# Patient Record
Sex: Female | Born: 1975 | Race: White | Hispanic: No | Marital: Single | Smoking: Never smoker
Health system: Southern US, Community
[De-identification: ages and names within clinical notes are randomized; demographics above are authoritative.]

## PROBLEM LIST (undated history)

## (undated) DIAGNOSIS — C4490 Unspecified malignant neoplasm of skin, unspecified: Secondary | ICD-10-CM

## (undated) DIAGNOSIS — D259 Leiomyoma of uterus, unspecified: Secondary | ICD-10-CM

## (undated) DIAGNOSIS — F419 Anxiety disorder, unspecified: Secondary | ICD-10-CM

## (undated) DIAGNOSIS — R87629 Unspecified abnormal cytological findings in specimens from vagina: Secondary | ICD-10-CM

## (undated) DIAGNOSIS — R87619 Unspecified abnormal cytological findings in specimens from cervix uteri: Secondary | ICD-10-CM

## (undated) DIAGNOSIS — M419 Scoliosis, unspecified: Secondary | ICD-10-CM

## (undated) DIAGNOSIS — O24419 Gestational diabetes mellitus in pregnancy, unspecified control: Secondary | ICD-10-CM

## (undated) HISTORY — DX: Unspecified abnormal cytological findings in specimens from cervix uteri: R87.619

## (undated) HISTORY — DX: Anxiety disorder, unspecified: F41.9

## (undated) HISTORY — PX: OTHER SURGICAL HISTORY: SHX169

## (undated) HISTORY — DX: Scoliosis, unspecified: M41.9

## (undated) HISTORY — DX: Unspecified abnormal cytological findings in specimens from vagina: R87.629

## (undated) HISTORY — DX: Unspecified malignant neoplasm of skin, unspecified: C44.90

## (undated) HISTORY — DX: Leiomyoma of uterus, unspecified: D25.9

## (undated) HISTORY — PX: AUGMENTATION MAMMAPLASTY: SUR837

---

## 2006-06-29 ENCOUNTER — Emergency Department (HOSPITAL_COMMUNITY): Admission: EM | Admit: 2006-06-29 | Discharge: 2006-06-29 | Payer: Self-pay | Admitting: Emergency Medicine

## 2017-07-01 ENCOUNTER — Encounter: Payer: Self-pay | Admitting: *Deleted

## 2017-07-01 ENCOUNTER — Encounter: Payer: Self-pay | Admitting: Obstetrics & Gynecology

## 2017-07-01 DIAGNOSIS — D219 Benign neoplasm of connective and other soft tissue, unspecified: Secondary | ICD-10-CM | POA: Insufficient documentation

## 2017-07-01 DIAGNOSIS — O09519 Supervision of elderly primigravida, unspecified trimester: Secondary | ICD-10-CM | POA: Insufficient documentation

## 2017-07-01 DIAGNOSIS — Z3400 Encounter for supervision of normal first pregnancy, unspecified trimester: Secondary | ICD-10-CM | POA: Insufficient documentation

## 2017-07-02 ENCOUNTER — Encounter (INDEPENDENT_AMBULATORY_CARE_PROVIDER_SITE_OTHER): Payer: Self-pay

## 2017-07-02 ENCOUNTER — Encounter: Payer: Self-pay | Admitting: Obstetrics & Gynecology

## 2017-07-02 ENCOUNTER — Ambulatory Visit (INDEPENDENT_AMBULATORY_CARE_PROVIDER_SITE_OTHER): Payer: 59 | Admitting: Obstetrics & Gynecology

## 2017-07-02 VITALS — BP 93/63 | HR 75 | Ht 69.0 in | Wt 171.0 lb

## 2017-07-02 DIAGNOSIS — Z23 Encounter for immunization: Secondary | ICD-10-CM | POA: Diagnosis not present

## 2017-07-02 DIAGNOSIS — Z3402 Encounter for supervision of normal first pregnancy, second trimester: Secondary | ICD-10-CM

## 2017-07-02 DIAGNOSIS — N760 Acute vaginitis: Secondary | ICD-10-CM | POA: Diagnosis not present

## 2017-07-02 DIAGNOSIS — B379 Candidiasis, unspecified: Secondary | ICD-10-CM | POA: Diagnosis not present

## 2017-07-02 DIAGNOSIS — B9689 Other specified bacterial agents as the cause of diseases classified elsewhere: Secondary | ICD-10-CM | POA: Diagnosis not present

## 2017-07-02 DIAGNOSIS — Z803 Family history of malignant neoplasm of breast: Secondary | ICD-10-CM | POA: Insufficient documentation

## 2017-07-02 DIAGNOSIS — Z3400 Encounter for supervision of normal first pregnancy, unspecified trimester: Secondary | ICD-10-CM

## 2017-07-02 MED ORDER — TRIAMCINOLONE ACETONIDE 0.5 % EX OINT: TOPICAL_OINTMENT | CUTANEOUS | 0 refills | Status: DC

## 2017-07-02 MED ORDER — METRONIDAZOLE 500 MG PO TABS: 500.0000 mg | ORAL_TABLET | Freq: Two times a day (BID) | ORAL | 0 refills | Status: DC

## 2017-07-02 MED ORDER — TERCONAZOLE 0.4 % VA CREA: 1.0000 | TOPICAL_CREAM | Freq: Every day | VAGINAL | 0 refills | Status: DC

## 2017-07-02 MED FILL — metroNIDAZOLE 500 MG TABS: 500 | 7 days supply | Qty: 14 | Fill #0

## 2017-07-02 MED FILL — TRIAMCINOLONE 0.5% OINTMENT: 0.5 | 8 days supply | Qty: 30 | Fill #0

## 2017-07-02 MED FILL — TERCONAZOLE 0.4% VAG CREAM: 0.4 | 7 days supply | Qty: 45 | Fill #0

## 2017-07-02 NOTE — Progress Notes (Signed)
Up to date on pap smear- normal results

## 2017-07-02 NOTE — Progress Notes (Signed)
  Subjective:    Caitlin Cain is a G1P0 [redacted]w[redacted]d being seen today for her first obstetrical visit.  Her obstetrical history is significant for advanced maternal age, large fibroids, vaginal pressure and discharge not responding to multiple OTC courses of Monistat. Patient does intend to breast feed. Pregnancy history fully reviewed.  Patient reports pelvic pressure from fibroids..  Vitals:   07/01/17 1210 07/02/17 1034  BP:  93/63  Pulse:  75  Weight:  171 lb (77.6 kg)  Height: 5\' 9"  (1.753 m)     HISTORY: OB History  Gravida Para Term Preterm AB Living  1            SAB TAB Ectopic Multiple Live Births               # Outcome Date GA Lbr Len/2nd Weight Sex Delivery Anes PTL Lv  1 Current              Past Medical History:  Diagnosis Date  . Abnormal Pap smear of cervix   . Anxiety   . Scoliosis   . Skin cancer   . Uterine fibroid   . Vaginal Pap smear, abnormal    History reviewed. No pertinent surgical history. Family History  Problem Relation Age of Onset  . Breast cancer Mother   . Heart disease Father   . Breast cancer Maternal Aunt   . Breast cancer Maternal Grandfather      Exam    Uterus:     Pelvic Exam:    Perineum: No Hemorrhoids   Vulva: erythematous   Vagina:  curdlike discharge, vaginal walls inflames and tender   pH: n/a   Cervix: closed, very anterior above symphysis due to large LUS fibroid   Adnexa: not evaluated   Bony Pelvis: average  System: Breast:  normal appearance, no masses or tenderness   Skin: normal coloration and turgor, no rashes    Neurologic: oriented, normal mood   Extremities: no deformities   HEENT sclera clear, anicteric   Mouth/Teeth mucous membranes moist, pharynx normal without lesions and dental hygiene good   Neck Not evaluated   Cardiovascular: regular rate and rhythm   Respiratory:  appears well, vitals normal, no respiratory distress, acyanotic, normal RR   Abdomen: large fibroids   Urinary: urethral  meatus normal      Assessment:    Pregnancy: G1P0 Patient Active Problem List   Diagnosis Date Noted  . Supervision of normal first pregnancy, antepartum 07/01/2017  . Uterine fibroid 07/01/2017  . Advanced maternal age, 1st pregnancy 07/01/2017        Plan:     Initial labs drawn. Prenatal vitamins. Problem list reviewed and updated. Declines all genetic testing MFM [place] for evaluation and growth  Vaginitis--Terazol needed clinically.  BD affirm sent.  Pt external inflammation and wil do local treatment with steroid ointment.  Pt going out of town this weekend and wants to have Flagyl on hand if BV is present.  Flagyl prescribed but she understnads NOT to take it until she hears from our office  Fibroids- ibuprofen once a day for 3 days as needed.  Pt understands use sparingly until 32 weeks.  Growth [place] for AMA and large fibroids which can cause IUGR.  Anxiety--Continue Vistiril for anxiety.  Silas Sacramento 07/02/2017

## 2017-07-03 LAB — CERVICOVAGINAL ANCILLARY ONLY
BACTERIAL VAGINITIS: NEGATIVE
Candida vaginitis: POSITIVE — AB

## 2017-07-06 ENCOUNTER — Telehealth: Payer: Self-pay | Admitting: *Deleted

## 2017-07-06 NOTE — Telephone Encounter (Signed)
Pt notified that she does not have BV and not to take the Flagyl but she does have yeast and she is to continue with the Terazol 7

## 2017-07-14 ENCOUNTER — Other Ambulatory Visit: Payer: Self-pay | Admitting: Obstetrics & Gynecology

## 2017-07-14 ENCOUNTER — Ambulatory Visit (HOSPITAL_COMMUNITY)
Admission: RE | Admit: 2017-07-14 | Discharge: 2017-07-14 | Disposition: A | Discharge status: Home / Self Care | Payer: 59 | Source: Ambulatory Visit | Attending: Obstetrics & Gynecology | Admitting: Obstetrics & Gynecology

## 2017-07-14 DIAGNOSIS — Z3689 Encounter for other specified antenatal screening: Secondary | ICD-10-CM | POA: Insufficient documentation

## 2017-07-14 DIAGNOSIS — D251 Intramural leiomyoma of uterus: Secondary | ICD-10-CM

## 2017-07-14 DIAGNOSIS — Z3A23 23 weeks gestation of pregnancy: Secondary | ICD-10-CM | POA: Insufficient documentation

## 2017-07-14 DIAGNOSIS — Z3400 Encounter for supervision of normal first pregnancy, unspecified trimester: Secondary | ICD-10-CM

## 2017-07-14 DIAGNOSIS — O09522 Supervision of elderly multigravida, second trimester: Secondary | ICD-10-CM | POA: Diagnosis present

## 2017-07-14 DIAGNOSIS — D259 Leiomyoma of uterus, unspecified: Secondary | ICD-10-CM | POA: Insufficient documentation

## 2017-07-14 DIAGNOSIS — O09512 Supervision of elderly primigravida, second trimester: Secondary | ICD-10-CM

## 2017-07-14 DIAGNOSIS — O3412 Maternal care for benign tumor of corpus uteri, second trimester: Secondary | ICD-10-CM | POA: Diagnosis not present

## 2017-07-16 ENCOUNTER — Other Ambulatory Visit: Payer: Self-pay | Admitting: *Deleted

## 2017-07-16 DIAGNOSIS — O09521 Supervision of elderly multigravida, first trimester: Secondary | ICD-10-CM

## 2017-07-17 ENCOUNTER — Other Ambulatory Visit: Payer: Self-pay | Admitting: *Deleted

## 2017-07-17 DIAGNOSIS — B3731 Acute candidiasis of vulva and vagina: Secondary | ICD-10-CM

## 2017-07-17 DIAGNOSIS — B373 Candidiasis of vulva and vagina: Secondary | ICD-10-CM

## 2017-07-17 MED ORDER — TERCONAZOLE 0.4 % VA CREA: 1.0000 | TOPICAL_CREAM | Freq: Every day | VAGINAL | 0 refills | Status: DC

## 2017-07-17 MED FILL — TERCONAZOLE 0.4% VAG CREAM: 0.4 | 7 days supply | Qty: 45 | Fill #0

## 2017-08-03 ENCOUNTER — Telehealth: Payer: Self-pay | Admitting: Obstetrics & Gynecology
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
## 2017-08-04 ENCOUNTER — Ambulatory Visit (INDEPENDENT_AMBULATORY_CARE_PROVIDER_SITE_OTHER): Payer: 59 | Admitting: Obstetrics & Gynecology

## 2017-08-04 VITALS — BP 107/63 | HR 76 | Wt 177.0 lb

## 2017-08-04 DIAGNOSIS — N1339 Other hydronephrosis: Secondary | ICD-10-CM

## 2017-08-04 DIAGNOSIS — D252 Subserosal leiomyoma of uterus: Secondary | ICD-10-CM

## 2017-08-04 DIAGNOSIS — Z3400 Encounter for supervision of normal first pregnancy, unspecified trimester: Secondary | ICD-10-CM

## 2017-08-04 DIAGNOSIS — D251 Intramural leiomyoma of uterus: Secondary | ICD-10-CM

## 2017-08-04 MED ORDER — HYDROCODONE-ACETAMINOPHEN 5-325 MG PO TABS: ORAL_TABLET | ORAL | 0 refills | Status: DC

## 2017-08-04 MED ORDER — ONDANSETRON HCL 4 MG PO TABS: 4.0000 mg | ORAL_TABLET | Freq: Three times a day (TID) | ORAL | 0 refills | Status: DC | PRN

## 2017-08-05 ENCOUNTER — Ambulatory Visit (HOSPITAL_COMMUNITY)
Admission: RE | Admit: 2017-08-05 | Discharge: 2017-08-05 | Disposition: A | Discharge status: Home / Self Care | Payer: 59 | Source: Ambulatory Visit | Attending: Obstetrics & Gynecology | Admitting: Obstetrics & Gynecology

## 2017-08-05 ENCOUNTER — Ambulatory Visit: Payer: 59

## 2017-08-05 DIAGNOSIS — N1339 Other hydronephrosis: Secondary | ICD-10-CM

## 2017-08-05 DIAGNOSIS — N1330 Unspecified hydronephrosis: Secondary | ICD-10-CM | POA: Diagnosis not present

## 2017-08-05 DIAGNOSIS — N390 Urinary tract infection, site not specified: Secondary | ICD-10-CM

## 2017-08-05 LAB — COMPREHENSIVE METABOLIC PANEL
AG RATIO: 1.1 (calc) (ref 1.0–2.5)
ALT: 18 U/L (ref 6–29)
AST: 14 U/L (ref 10–30)
Albumin: 3.4 g/dL — ABNORMAL LOW (ref 3.6–5.1)
Alkaline phosphatase (APISO): 83 U/L (ref 33–115)
BUN: 7 mg/dL (ref 7–25)
CALCIUM: 8.9 mg/dL (ref 8.6–10.2)
CO2: 25 mmol/L (ref 20–32)
Chloride: 102 mmol/L (ref 98–110)
Creat: 0.55 mg/dL (ref 0.50–1.10)
GLUCOSE: 80 mg/dL (ref 65–99)
Globulin: 3.1 g/dL (calc) (ref 1.9–3.7)
Potassium: 4 mmol/L (ref 3.5–5.3)
Sodium: 136 mmol/L (ref 135–146)
Total Bilirubin: 0.3 mg/dL (ref 0.2–1.2)
Total Protein: 6.5 g/dL (ref 6.1–8.1)

## 2017-08-05 LAB — CBC
HEMATOCRIT: 37.7 % (ref 35.0–45.0)
Hemoglobin: 13 g/dL (ref 11.7–15.5)
MCH: 29.7 pg (ref 27.0–33.0)
MCHC: 34.5 g/dL (ref 32.0–36.0)
MCV: 86.3 fL (ref 80.0–100.0)
MPV: 9.3 fL (ref 7.5–12.5)
PLATELETS: 342 10*3/uL (ref 140–400)
RBC: 4.37 10*6/uL (ref 3.80–5.10)
RDW: 12.6 % (ref 11.0–15.0)
WBC: 12.6 10*3/uL — AB (ref 3.8–10.8)

## 2017-08-05 LAB — PROTEIN / CREATININE RATIO, URINE
Creatinine, Urine: 77 mg/dL (ref 20–275)
PROTEIN/CREAT RATIO: 195 mg/g{creat} — AB (ref 21–161)
TOTAL PROTEIN, URINE: 15 mg/dL (ref 5–24)

## 2017-08-05 NOTE — Progress Notes (Signed)
Pt advised to drop urine off at the office so that we can send for urine culture.  Pt informed that we will call her with results.  Pt stated understanding with no further questions.

## 2017-08-05 NOTE — Progress Notes (Signed)
Patient seen and assessed by nursing staff.  Agree with documentation and plan.  

## 2017-08-07 LAB — URINE CULTURE

## 2017-08-07 NOTE — Progress Notes (Signed)
   PRENATAL VISIT NOTE  Subjective:  Caitlin Cain is a 41 y.o. G1P0 at 10w3dbeing seen today for ongoing prenatal care.  She is currently monitored for the following issues for this high-risk pregnancy and has Supervision of normal first pregnancy, antepartum; Uterine fibroid; Advanced maternal age, 1st pregnancy; and Family history of breast cancer in first degree relative on her problem list.   [facility].  Pt found to have hydronephrosis on right due to cmopression by fibroid.   Pt also thought to have infarction of lower fibroid (no blood flow on [place].    Patient reports pelvic pain, urinary frequency, nausea when pain is severe.  Contractions: Not present. Vag. Bleeding: None.  Movement: Present. Denies leaking of fluid.   The following portions of the patient's history were reviewed and updated as appropriate: allergies, current medications, past family history, past medical history, past social history, past surgical history and problem list. Problem list updated.  Objective:   Vitals:   08/04/17 1449  BP: 107/63  Pulse: 76  Weight: 177 lb (80.3 kg)    Fetal Status: Fetal Heart Rate (bpm): 150   Movement: Present     General:  Alert, oriented and cooperative. Patient is in no acute distress.  Skin: Skin is warm and dry. No rash noted.   Cardiovascular: Normal heart rate noted  Respiratory: Normal respiratory effort, no problems with respiration noted  Abdomen: Soft, gravid, appropriate for gestational age.  Pain/Pressure: Present     Pelvic: Cervical exam performed      closed, very anterior and displaced by LUS/cervical fibroid.  Extremities: Normal range of motion.  Edema: None  Mental Status:  Normal mood and affect. Normal behavior. Normal judgment and thought content.   Assessment and Plan:  Pregnancy: G1P0 at 234w3d1. Other hydronephrosis - [place]; Future - CBC - Comp Met (CMET) - Ambulatory  referral to Urology - Protein / creatinine ratio, urine - UA, U cx  2. Supervision of normal first pregnancy, antepartum -GCT next visit  3. Intramural and subserous leiomyoma of uterus -Serial growth [place] MFM -PTL precautions reviewed -Limited Norco for pain (pt understands risk of NAS with continued use) -Ibuprofen prn; not to continue after 32 weeks -Zofran for nausea  Preterm labor symptoms and general obstetric precautions including but not limited to vaginal bleeding, contractions, leaking of fluid and fetal movement were reviewed in detail with the patient. Please refer to After Visit Summary for other counseling recommendations.  Return in about 2 weeks (around 08/18/2017).   KeSilas SacramentoMD

## 2017-08-18 ENCOUNTER — Encounter (HOSPITAL_COMMUNITY): Payer: Self-pay

## 2017-08-18 ENCOUNTER — Ambulatory Visit (HOSPITAL_COMMUNITY)
Admission: RE | Admit: 2017-08-18 | Discharge: 2017-08-18 | Disposition: A | Discharge status: Home / Self Care | Payer: 59 | Source: Ambulatory Visit | Attending: Obstetrics & Gynecology | Admitting: Obstetrics & Gynecology

## 2017-08-18 DIAGNOSIS — Z3A28 28 weeks gestation of pregnancy: Secondary | ICD-10-CM | POA: Diagnosis not present

## 2017-08-18 DIAGNOSIS — O09522 Supervision of elderly multigravida, second trimester: Secondary | ICD-10-CM | POA: Insufficient documentation

## 2017-08-18 DIAGNOSIS — O09521 Supervision of elderly multigravida, first trimester: Secondary | ICD-10-CM

## 2017-08-18 DIAGNOSIS — O3412 Maternal care for benign tumor of corpus uteri, second trimester: Secondary | ICD-10-CM | POA: Diagnosis not present

## 2017-08-18 DIAGNOSIS — D259 Leiomyoma of uterus, unspecified: Secondary | ICD-10-CM | POA: Diagnosis not present

## 2017-08-18 DIAGNOSIS — Z3689 Encounter for other specified antenatal screening: Secondary | ICD-10-CM | POA: Insufficient documentation

## 2017-08-19 ENCOUNTER — Ambulatory Visit (INDEPENDENT_AMBULATORY_CARE_PROVIDER_SITE_OTHER): Payer: 59 | Admitting: Obstetrics & Gynecology

## 2017-08-19 ENCOUNTER — Other Ambulatory Visit: Payer: Self-pay | Admitting: Obstetrics & Gynecology

## 2017-08-19 VITALS — BP 107/66 | HR 76 | Wt 179.0 lb

## 2017-08-19 DIAGNOSIS — B3731 Acute candidiasis of vulva and vagina: Secondary | ICD-10-CM

## 2017-08-19 DIAGNOSIS — O09513 Supervision of elderly primigravida, third trimester: Secondary | ICD-10-CM

## 2017-08-19 DIAGNOSIS — B373 Candidiasis of vulva and vagina: Secondary | ICD-10-CM

## 2017-08-19 DIAGNOSIS — Z331 Pregnant state, incidental: Secondary | ICD-10-CM

## 2017-08-19 DIAGNOSIS — O09512 Supervision of elderly primigravida, second trimester: Secondary | ICD-10-CM

## 2017-08-19 DIAGNOSIS — O09522 Supervision of elderly multigravida, second trimester: Secondary | ICD-10-CM

## 2017-08-19 DIAGNOSIS — F909 Attention-deficit hyperactivity disorder, unspecified type: Secondary | ICD-10-CM | POA: Insufficient documentation

## 2017-08-19 LAB — TIQ-NTM

## 2017-08-19 MED ORDER — TERCONAZOLE 0.4 % VA CREA: 1.0000 | TOPICAL_CREAM | Freq: Every day | VAGINAL | 0 refills | Status: DC

## 2017-08-19 MED ORDER — ZOLPIDEM TARTRATE 10 MG PO TABS: 5.0000 mg | ORAL_TABLET | Freq: Every evening | ORAL | 1 refills | Status: DC | PRN

## 2017-08-19 MED ORDER — TRIAMCINOLONE ACETONIDE 0.5 % EX CREA: TOPICAL_CREAM | Freq: Two times a day (BID) | CUTANEOUS | Status: DC

## 2017-08-19 MED FILL — TERCONAZOLE 0.4% VAG CREAM: 0.4 | 7 days supply | Qty: 45 | Fill #0

## 2017-08-19 MED FILL — ZOLPIDEM TARTRATE 10 MG TAB: 10 | 30 days supply | Qty: 15 | Fill #0

## 2017-08-19 NOTE — Progress Notes (Signed)
   PRENATAL VISIT NOTE  Subjective:  Caitlin Cain is a 41 y.o. G1P0 at [redacted]w[redacted]d being seen today for ongoing prenatal care.  She is currently monitored for the following issues for this high-risk pregnancy and has Supervision of normal first pregnancy, antepartum; Uterine fibroid; Advanced maternal age, 1st pregnancy; Family history of breast cancer in first degree relative; and Attention deficit hyperactivity disorder on their problem list.  Patient reports pan from fibroid and problems sleeping.  Benadryl not wworking.  Contractions: Not present. Vag. Bleeding: None.  Movement: Present. Denies leaking of fluid.   The following portions of the patient's history were reviewed and updated as appropriate: allergies, current medications, past family history, past medical history, past social history, past surgical history and problem list. Problem list updated.  Objective:   Vitals:   08/19/17 1102  BP: 107/66  Pulse: 76  Weight: 179 lb (81.2 kg)    Fetal Status: Fetal Heart Rate (bpm): 143   Movement: Present     General:  Alert, oriented and cooperative. Patient is in no acute distress.  Skin: Skin is warm and dry. No rash noted.   Cardiovascular: Normal heart rate noted  Respiratory: Normal respiratory effort, no problems with respiration noted  Abdomen: Soft, gravid, appropriate for gestational age.  Pain/Pressure: Present     Pelvic: Cervical exam performed      CERVIX CLOSED AND DISPLACED BY FIBROID.  VAGINA RED WITH D/C CONSISTENT WITH YEAST  Extremities: Normal range of motion.  Edema: None  Mental Status:  Normal mood and affect. Normal behavior. Normal judgment and thought content.   Assessment and Plan:  Pregnancy: G1P0 at [redacted]w[redacted]d  1. Elderly multigravida in third trimester - [place] MFM OB FOLLOW UP; Future--Growth Q4 weeks - Antenatal testing at 36 weeks - 2Hr GTT w/ 1 Hr Carpenter 75 g - CBC - HIV antibody (with reflex) - RPR  2. Vaginal yeast infection -  Cervicovaginal ancillary only - Fungus Culture & Smear - Terazol   Preterm labor symptoms and general obstetric precautions including but not limited to vaginal bleeding, contractions, leaking of fluid and fetal movement were reviewed in detail with the patient. Please refer to After Visit Summary for other counseling recommendations.  Return in about 2 weeks (around 09/02/2017).   Silas Sacramento, MD

## 2017-08-20 ENCOUNTER — Other Ambulatory Visit: Payer: Self-pay | Admitting: *Deleted

## 2017-08-20 MED ORDER — TRIAMCINOLONE ACETONIDE 0.5 % EX OINT: TOPICAL_OINTMENT | CUTANEOUS | 0 refills | Status: DC

## 2017-08-20 MED FILL — TRIAMCINOLONE 0.5% OINTMENT: 0.5 | 10 days supply | Qty: 30 | Fill #0

## 2017-08-20 NOTE — Addendum Note (Signed)
Addended by: Asencion Islam on: 08/20/2017 10:43 AM   Modules accepted: Orders

## 2017-08-20 NOTE — Telephone Encounter (Signed)
Pt called stating that her kenalog cream was not at her pharmacy.  Med resend to [facility] outpatient pharmacy.

## 2017-08-21 ENCOUNTER — Other Ambulatory Visit (INDEPENDENT_AMBULATORY_CARE_PROVIDER_SITE_OTHER): Payer: 59

## 2017-08-21 DIAGNOSIS — Z23 Encounter for immunization: Secondary | ICD-10-CM

## 2017-08-21 DIAGNOSIS — B3731 Acute candidiasis of vulva and vagina: Secondary | ICD-10-CM

## 2017-08-21 DIAGNOSIS — B373 Candidiasis of vulva and vagina: Secondary | ICD-10-CM

## 2017-08-21 DIAGNOSIS — Z3400 Encounter for supervision of normal first pregnancy, unspecified trimester: Secondary | ICD-10-CM

## 2017-08-21 NOTE — Addendum Note (Signed)
Addended by: Asencion Islam on: 08/21/2017 08:39 AM   Modules accepted: Orders

## 2017-08-24 ENCOUNTER — Telehealth: Payer: Self-pay

## 2017-08-24 ENCOUNTER — Other Ambulatory Visit: Payer: Self-pay | Admitting: Obstetrics & Gynecology

## 2017-08-24 DIAGNOSIS — O24410 Gestational diabetes mellitus in pregnancy, diet controlled: Secondary | ICD-10-CM

## 2017-08-24 LAB — RPR: RPR: NONREACTIVE

## 2017-08-24 LAB — 2HR GTT W 1 HR, CARPENTER, 75 G
GLUCOSE, 1 HR, GEST: 174 mg/dL (ref 65–179)
GLUCOSE, 2 HR, GEST: 182 mg/dL — AB (ref 65–152)
GLUCOSE, FASTING, GEST: 78 mg/dL (ref 65–91)

## 2017-08-24 LAB — CBC
HCT: 36.9 % (ref 35.0–45.0)
Hemoglobin: 13 g/dL (ref 11.7–15.5)
MCH: 29.6 pg (ref 27.0–33.0)
MCHC: 35.2 g/dL (ref 32.0–36.0)
MCV: 84.1 fL (ref 80.0–100.0)
MPV: 9.3 fL (ref 7.5–12.5)
PLATELETS: 276 10*3/uL (ref 140–400)
RBC: 4.39 10*6/uL (ref 3.80–5.10)
RDW: 12.8 % (ref 11.0–15.0)
WBC: 12.7 10*3/uL — ABNORMAL HIGH (ref 3.8–10.8)

## 2017-08-24 LAB — HIV ANTIBODY (ROUTINE TESTING W REFLEX): HIV: NONREACTIVE

## 2017-08-24 NOTE — Progress Notes (Signed)
GDM---referred to Nutrition Yeast / wet prep still pending from Last Wednesday; RN to call lab. Culure pending which is expected. Other labs WNL.  Pt called and results discussed.

## 2017-08-24 NOTE — Telephone Encounter (Signed)
Called [facility] about Candisis PCR- representative states that we will have preliminary results tomorrow 08-25-17 and final result on Wednesday 08-26-17. Kathrene Alu RN

## 2017-08-25 ENCOUNTER — Encounter: Payer: Self-pay | Admitting: Obstetrics & Gynecology

## 2017-08-25 DIAGNOSIS — O24419 Gestational diabetes mellitus in pregnancy, unspecified control: Secondary | ICD-10-CM | POA: Insufficient documentation

## 2017-08-25 LAB — TEST AUTHORIZATION

## 2017-08-25 LAB — CANDIDIASIS, PCR
C. ALBICANS, DNA: DETECTED — AB
C. GLABRATA, DNA: NOT DETECTED
C. PARAPSILOSIS, DNA: NOT DETECTED
C. tropicalis, DNA: NOT DETECTED

## 2017-08-25 LAB — FUNGUS CULTURE W SMEAR

## 2017-08-26 ENCOUNTER — Encounter: Payer: 59 | Attending: Obstetrics & Gynecology | Admitting: Registered"

## 2017-08-26 ENCOUNTER — Encounter: Payer: Self-pay | Admitting: Registered"

## 2017-08-26 DIAGNOSIS — Z713 Dietary counseling and surveillance: Secondary | ICD-10-CM | POA: Diagnosis not present

## 2017-08-26 DIAGNOSIS — O24410 Gestational diabetes mellitus in pregnancy, diet controlled: Secondary | ICD-10-CM | POA: Diagnosis present

## 2017-08-26 DIAGNOSIS — Z3A00 Weeks of gestation of pregnancy not specified: Secondary | ICD-10-CM | POA: Diagnosis not present

## 2017-08-26 DIAGNOSIS — O09519 Supervision of elderly primigravida, unspecified trimester: Secondary | ICD-10-CM | POA: Insufficient documentation

## 2017-08-26 DIAGNOSIS — R7309 Other abnormal glucose: Secondary | ICD-10-CM | POA: Insufficient documentation

## 2017-08-26 HISTORY — DX: Other abnormal glucose: R73.09

## 2017-08-26 LAB — FUNGUS CULTURE W SMEAR
MICRO NUMBER: 81295510
SPECIMEN QUALITY:: ADEQUATE

## 2017-08-26 NOTE — Progress Notes (Signed)
Patient was seen on 08/26/2017 for Gestational Diabetes self-management class at [facility]. The following learning objectives were met by the patient during this course:   States the definition of Gestational Diabetes  States why dietary management is important in controlling blood glucose  Describes the effects each nutrient has on blood glucose levels  Demonstrates ability to create a balanced meal plan  Demonstrates carbohydrate counting   States when to check blood glucose levels  Demonstrates proper blood glucose monitoring techniques  States the effect of stress and exercise on blood glucose levels  States the importance of limiting caffeine and abstaining from alcohol and smoking  Blood glucose monitor given: none Lot # n/a Exp: n/a Blood glucose reading: n/a  Patient instructed to monitor glucose levels: FBS: 60 - <95 1 hour: <140 2 hour: <120  Patient received handouts:  Nutrition Diabetes and Pregnancy  Carbohydrate Counting List  Patient will be seen for follow-up as needed.

## 2017-08-31 ENCOUNTER — Telehealth: Payer: Self-pay

## 2017-08-31 ENCOUNTER — Other Ambulatory Visit: Payer: Self-pay | Admitting: Obstetrics & Gynecology

## 2017-08-31 ENCOUNTER — Other Ambulatory Visit: Payer: Self-pay | Admitting: *Deleted

## 2017-08-31 DIAGNOSIS — O09519 Supervision of elderly primigravida, unspecified trimester: Secondary | ICD-10-CM

## 2017-08-31 MED ORDER — FLUCONAZOLE 150 MG PO TABS: 150.0000 mg | ORAL_TABLET | Freq: Once | ORAL | 0 refills | Status: AC

## 2017-08-31 MED FILL — FLUCONAZOLE 150 MG TABLET: 150 | 1 days supply | Qty: 1 | Fill #0

## 2017-08-31 NOTE — Telephone Encounter (Signed)
PT called asking about the results from Fungus Culture with Smear. I told her it did show yeast and Dr.Leggett had given her Terazol. Pt states that she did not think the Terazol was helping as much as it should. We told pt we would reach out to Dr.Leggett and see if she had any other recommendations to help with the yeast. Pt expressed understanding.

## 2017-08-31 NOTE — Progress Notes (Signed)
Pt has candida albicans on culture.  Pt has used Terazol for several weeks without successful treatment.  Discussed using Diflucan. It can cause miscarriage up to 22 weeks.  Will try Diflucan the next time she is symptomatic as benefits outweigh the risks in 3rd trimester.  PT agrees with plan.

## 2017-09-01 ENCOUNTER — Encounter (HOSPITAL_COMMUNITY): Payer: Self-pay

## 2017-09-03 ENCOUNTER — Encounter: Payer: 59 | Admitting: Obstetrics and Gynecology

## 2017-09-03 ENCOUNTER — Ambulatory Visit (INDEPENDENT_AMBULATORY_CARE_PROVIDER_SITE_OTHER): Payer: 59 | Admitting: Obstetrics and Gynecology

## 2017-09-03 ENCOUNTER — Encounter: Payer: Self-pay | Admitting: Obstetrics and Gynecology

## 2017-09-03 VITALS — BP 103/65 | HR 83 | Wt 177.0 lb

## 2017-09-03 DIAGNOSIS — Z3400 Encounter for supervision of normal first pregnancy, unspecified trimester: Secondary | ICD-10-CM

## 2017-09-03 DIAGNOSIS — O24410 Gestational diabetes mellitus in pregnancy, diet controlled: Secondary | ICD-10-CM

## 2017-09-03 DIAGNOSIS — O09513 Supervision of elderly primigravida, third trimester: Secondary | ICD-10-CM

## 2017-09-03 MED ORDER — ZOLPIDEM TARTRATE 10 MG PO TABS
5.0000 mg | ORAL_TABLET | Freq: Every evening | ORAL | 1 refills | Status: DC | PRN
Start: 2017-09-03 — End: 2017-10-07

## 2017-09-03 NOTE — Progress Notes (Signed)
Ketones-mod

## 2017-09-03 NOTE — Progress Notes (Signed)
   PRENATAL VISIT NOTE  Subjective:  Caitlin Cain is a 41 y.o. G1P0 at [redacted]w[redacted]d being seen today for ongoing prenatal care.  She is currently monitored for the following issues for this high-risk pregnancy and has Supervision of normal first pregnancy, antepartum; Uterine fibroid; Advanced maternal age, 1st pregnancy; Family history of breast cancer in first degree relative; Attention deficit hyperactivity disorder; Gestational diabetes; and Impaired glucose tolerance test on their problem list.  Patient reports no complaints.  Contractions: Not present. Vag. Bleeding: None.  Movement: Present. Denies leaking of fluid.   The following portions of the patient's history were reviewed and updated as appropriate: allergies, current medications, past family history, past medical history, past social history, past surgical history and problem list. Problem list updated.  Objective:   Vitals:   09/03/17 1335  BP: 103/65  Pulse: 83  Weight: 177 lb (80.3 kg)    Fetal Status: Fetal Heart Rate (bpm): 144 Fundal Height: 31 cm Movement: Present     General:  Alert, oriented and cooperative. Patient is in no acute distress.  Skin: Skin is warm and dry. No rash noted.   Cardiovascular: Normal heart rate noted  Respiratory: Normal respiratory effort, no problems with respiration noted  Abdomen: Soft, gravid, appropriate for gestational age.  Pain/Pressure: Present     Pelvic: Cervical exam deferred        Extremities: Normal range of motion.  Edema: None  Mental Status:  Normal mood and affect. Normal behavior. Normal judgment and thought content.   Assessment and Plan:  Pregnancy: G1P0 at [redacted]w[redacted]d  1. Diet controlled gestational diabetes mellitus (GDM) in third trimester CBG reviewed and all within range. Will continue with diet control Follow up growth ultrasound 12/11  2. Supervision of normal first pregnancy, antepartum Patient is doing well without complaints Refill on ambien  provided  3. Elderly primigravida in third trimester   Preterm labor symptoms and general obstetric precautions including but not limited to vaginal bleeding, contractions, leaking of fluid and fetal movement were reviewed in detail with the patient. Please refer to After Visit Summary for other counseling recommendations.  Return in about 2 weeks (around 09/17/2017) for ROB.   Mora Bellman, MD

## 2017-09-07 ENCOUNTER — Other Ambulatory Visit: Payer: Self-pay | Admitting: *Deleted

## 2017-09-07 MED ORDER — FLUCONAZOLE 150 MG PO TABS: ORAL_TABLET | ORAL | 0 refills | Status: DC

## 2017-09-07 MED FILL — FLUCONAZOLE 150 MG TABLET: 150 | 3 days supply | Qty: 3 | Fill #0

## 2017-09-07 NOTE — Telephone Encounter (Signed)
Per VO Dr Gala Romney has RX for Diflucan 150 mg 1 dose every 3 days for 3 doses.  This was sent to [facility] outpatient pharmacy

## 2017-09-09 MED FILL — ZOLPIDEM TARTRATE 10 MG TAB: 10 | 30 days supply | Qty: 20 | Fill #0

## 2017-09-15 ENCOUNTER — Ambulatory Visit (HOSPITAL_COMMUNITY)
Admission: RE | Admit: 2017-09-15 | Discharge: 2017-09-15 | Disposition: A | Discharge status: Home / Self Care | Payer: 59 | Source: Ambulatory Visit | Attending: Obstetrics & Gynecology | Admitting: Obstetrics & Gynecology

## 2017-09-15 ENCOUNTER — Other Ambulatory Visit: Payer: Self-pay | Admitting: Obstetrics & Gynecology

## 2017-09-15 ENCOUNTER — Encounter (HOSPITAL_COMMUNITY): Payer: Self-pay

## 2017-09-15 DIAGNOSIS — D259 Leiomyoma of uterus, unspecified: Secondary | ICD-10-CM | POA: Diagnosis not present

## 2017-09-15 DIAGNOSIS — O09523 Supervision of elderly multigravida, third trimester: Secondary | ICD-10-CM | POA: Diagnosis not present

## 2017-09-15 DIAGNOSIS — O09522 Supervision of elderly multigravida, second trimester: Secondary | ICD-10-CM

## 2017-09-15 DIAGNOSIS — Z362 Encounter for other antenatal screening follow-up: Secondary | ICD-10-CM

## 2017-09-15 DIAGNOSIS — O24410 Gestational diabetes mellitus in pregnancy, diet controlled: Secondary | ICD-10-CM | POA: Diagnosis not present

## 2017-09-15 DIAGNOSIS — O3413 Maternal care for benign tumor of corpus uteri, third trimester: Secondary | ICD-10-CM | POA: Insufficient documentation

## 2017-09-15 DIAGNOSIS — Z3A32 32 weeks gestation of pregnancy: Secondary | ICD-10-CM | POA: Diagnosis not present

## 2017-09-16 ENCOUNTER — Other Ambulatory Visit (HOSPITAL_COMMUNITY): Payer: Self-pay | Admitting: *Deleted

## 2017-09-16 DIAGNOSIS — O09513 Supervision of elderly primigravida, third trimester: Secondary | ICD-10-CM

## 2017-09-23 ENCOUNTER — Other Ambulatory Visit: Payer: Self-pay | Admitting: *Deleted

## 2017-09-23 ENCOUNTER — Ambulatory Visit (INDEPENDENT_AMBULATORY_CARE_PROVIDER_SITE_OTHER): Payer: 59 | Admitting: Obstetrics & Gynecology

## 2017-09-23 VITALS — BP 98/62 | HR 78 | Wt 179.0 lb

## 2017-09-23 DIAGNOSIS — N898 Other specified noninflammatory disorders of vagina: Secondary | ICD-10-CM

## 2017-09-23 DIAGNOSIS — O24419 Gestational diabetes mellitus in pregnancy, unspecified control: Secondary | ICD-10-CM

## 2017-09-23 DIAGNOSIS — R7302 Impaired glucose tolerance (oral): Secondary | ICD-10-CM

## 2017-09-23 DIAGNOSIS — R7309 Other abnormal glucose: Secondary | ICD-10-CM

## 2017-09-23 DIAGNOSIS — D251 Intramural leiomyoma of uterus: Secondary | ICD-10-CM

## 2017-09-23 NOTE — Progress Notes (Signed)
Pt c/o of yeast infection symptoms

## 2017-09-23 NOTE — Progress Notes (Signed)
   PRENATAL VISIT NOTE  Subjective:  Caitlin Cain is a 41 y.o. G1P0 at [redacted]w[redacted]d being seen today for ongoing prenatal care.  She is currently monitored for the following issues for this high-risk pregnancy and has Supervision of normal first pregnancy, antepartum; Uterine fibroid; Advanced maternal age, 1st pregnancy; Family history of breast cancer in first degree relative; Attention deficit hyperactivity disorder; Gestational diabetes; and Impaired glucose tolerance test on their problem list.  Patient reports vaginal irritation.  Contractions: Not present. Vag. Bleeding: None.  Movement: Present. Denies leaking of fluid.   The following portions of the patient's history were reviewed and updated as appropriate: allergies, current medications, past family history, past medical history, past social history, past surgical history and problem list. Problem list updated.  Objective:   Vitals:   09/23/17 0928  BP: 98/62  Pulse: 78  Weight: 179 lb (81.2 kg)    Fetal Status: Fetal Heart Rate (bpm): 131   Movement: Present     General:  Alert, oriented and cooperative. Patient is in no acute distress.  Skin: Skin is warm and dry. No rash noted.   Cardiovascular: Normal heart rate noted  Respiratory: Normal respiratory effort, no problems with respiration noted  Abdomen: Soft, gravid, appropriate for gestational age.  Pain/Pressure: Present     Pelvic: Cervical exam performed      closed and overriding fibroid. Very anterior behind pubic symphysis.  Extremities: Normal range of motion.  Edema: None  Mental Status:  Normal mood and affect. Normal behavior. Normal judgment and thought content.   Assessment and Plan:  Pregnancy: G1P0 at [redacted]w[redacted]d  1. Vaginal discharge Physical exam much better after 3 doses of diflucan  No need to use topical steroid - Cervicovaginal ancillary only Will treat based on results.  2. Impaired glucose tolerance test Pt ran out of lancets and has not checked  fo rtwo days.  Going to pharmacy now  3. Intramural leiomyoma of uterus Transverse head maternal right.  Given edge of fibroid high on uterine wall, will plan for classical uterine incision to avoid fibroid entirely.  Pt is done with childbearing and wants hysterectomy.    Preterm labor symptoms and general obstetric precautions including but not limited to vaginal bleeding, contractions, leaking of fluid and fetal movement were reviewed in detail with the patient. Please refer to After Visit Summary for other counseling recommendations.  No Follow-up on file.   Silas Sacramento, MD

## 2017-09-24 ENCOUNTER — Telehealth: Payer: Self-pay | Admitting: *Deleted

## 2017-09-24 MED ORDER — FLUCONAZOLE 150 MG PO TABS: ORAL_TABLET | ORAL | 0 refills | Status: DC

## 2017-09-24 MED FILL — FLUCONAZOLE 150 MG TABLET: 150 | 3 days supply | Qty: 2 | Fill #0

## 2017-09-24 NOTE — Telephone Encounter (Signed)
Pt called inquiring about her Aptima results.  They are still processing.  She wants to go ahead and get Diflucan because she is afraid the results won't come back before office closes for the holiday.  Spoke with Dr Gala Romney who allowed her to go ahead a take Diflucan.  This was sent to [facility] pharmcy.  She also states that her fibroid is "really poking out" and is uncomfortable.  She is concerned that she may need C-Section earlier than scheduled.  Pt does have a rtn OB appt with Dr Gala Romney 10/07/16.  Advised if the pain becomes worse and cannot handle the pain to go to [facility]

## 2017-09-25 ENCOUNTER — Encounter: Payer: Self-pay | Admitting: Obstetrics & Gynecology

## 2017-09-25 LAB — CERVICOVAGINAL ANCILLARY ONLY
BACTERIAL VAGINITIS: NEGATIVE
Candida vaginitis: POSITIVE — AB

## 2017-09-27 ENCOUNTER — Encounter: Payer: Self-pay | Admitting: Obstetrics & Gynecology

## 2017-09-28 ENCOUNTER — Encounter: Payer: Self-pay | Admitting: Obstetrics and Gynecology

## 2017-09-28 ENCOUNTER — Encounter: Payer: Self-pay | Admitting: Obstetrics & Gynecology

## 2017-10-07 ENCOUNTER — Ambulatory Visit (INDEPENDENT_AMBULATORY_CARE_PROVIDER_SITE_OTHER): Payer: 59 | Admitting: Obstetrics & Gynecology

## 2017-10-07 VITALS — BP 98/62 | HR 83 | Wt 181.0 lb

## 2017-10-07 DIAGNOSIS — O24410 Gestational diabetes mellitus in pregnancy, diet controlled: Secondary | ICD-10-CM

## 2017-10-07 DIAGNOSIS — T7431XA Adult psychological abuse, confirmed, initial encounter: Secondary | ICD-10-CM | POA: Insufficient documentation

## 2017-10-07 DIAGNOSIS — Z3400 Encounter for supervision of normal first pregnancy, unspecified trimester: Secondary | ICD-10-CM

## 2017-10-07 DIAGNOSIS — N760 Acute vaginitis: Secondary | ICD-10-CM | POA: Diagnosis not present

## 2017-10-07 DIAGNOSIS — T7431XD Adult psychological abuse, confirmed, subsequent encounter: Secondary | ICD-10-CM

## 2017-10-07 MED ORDER — TERCONAZOLE 0.4 % VA CREA: 1.0000 | TOPICAL_CREAM | Freq: Every day | VAGINAL | 0 refills | Status: DC

## 2017-10-07 MED ORDER — TRIAMCINOLONE ACETONIDE 0.5 % EX OINT: TOPICAL_OINTMENT | CUTANEOUS | 0 refills | Status: DC

## 2017-10-07 MED ORDER — ZOLPIDEM TARTRATE 5 MG PO TABS: 5.0000 mg | ORAL_TABLET | Freq: Every evening | ORAL | 0 refills | Status: DC | PRN

## 2017-10-07 MED FILL — TERCONAZOLE 0.4% VAG CREAM: 0.4 | 7 days supply | Qty: 45 | Fill #0

## 2017-10-07 MED FILL — TRIAMCINOLONE 0.5% OINTMENT: 0.5 | 8 days supply | Qty: 30 | Fill #0

## 2017-10-07 MED FILL — ZOLPIDEM TARTRATE 5 MG TAB: 5 | 30 days supply | Qty: 30 | Fill #0
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
## 2017-10-08 LAB — CERVICOVAGINAL ANCILLARY ONLY
Bacterial vaginitis: NEGATIVE
CANDIDA VAGINITIS: POSITIVE — AB

## 2017-10-12 ENCOUNTER — Ambulatory Visit (HOSPITAL_COMMUNITY)
Admission: RE | Admit: 2017-10-12 | Discharge: 2017-10-12 | Disposition: A | Discharge status: Home / Self Care | Payer: 59 | Source: Ambulatory Visit | Attending: Obstetrics & Gynecology | Admitting: Obstetrics & Gynecology

## 2017-10-12 DIAGNOSIS — O24410 Gestational diabetes mellitus in pregnancy, diet controlled: Secondary | ICD-10-CM | POA: Diagnosis not present

## 2017-10-12 DIAGNOSIS — Z362 Encounter for other antenatal screening follow-up: Secondary | ICD-10-CM | POA: Insufficient documentation

## 2017-10-12 DIAGNOSIS — O09513 Supervision of elderly primigravida, third trimester: Secondary | ICD-10-CM

## 2017-10-12 DIAGNOSIS — O09523 Supervision of elderly multigravida, third trimester: Secondary | ICD-10-CM | POA: Diagnosis not present

## 2017-10-12 DIAGNOSIS — Z3A35 35 weeks gestation of pregnancy: Secondary | ICD-10-CM | POA: Diagnosis not present

## 2017-10-12 DIAGNOSIS — O24419 Gestational diabetes mellitus in pregnancy, unspecified control: Secondary | ICD-10-CM

## 2017-10-12 DIAGNOSIS — D259 Leiomyoma of uterus, unspecified: Secondary | ICD-10-CM | POA: Insufficient documentation

## 2017-10-12 DIAGNOSIS — O321XX0 Maternal care for breech presentation, not applicable or unspecified: Secondary | ICD-10-CM | POA: Diagnosis not present

## 2017-10-12 DIAGNOSIS — O3413 Maternal care for benign tumor of corpus uteri, third trimester: Secondary | ICD-10-CM | POA: Insufficient documentation

## 2017-10-15 ENCOUNTER — Ambulatory Visit (INDEPENDENT_AMBULATORY_CARE_PROVIDER_SITE_OTHER): Payer: 59 | Admitting: *Deleted

## 2017-10-15 VITALS — BP 95/52 | HR 69

## 2017-10-15 DIAGNOSIS — O24410 Gestational diabetes mellitus in pregnancy, diet controlled: Secondary | ICD-10-CM

## 2017-10-15 NOTE — Progress Notes (Signed)
Pt c/o sinus congestion and pressure.  Throat and ears checked by Dr Hulan Fray and Sudafed recommended.

## 2017-10-19 ENCOUNTER — Encounter (HOSPITAL_COMMUNITY): Payer: Self-pay

## 2017-10-19 ENCOUNTER — Ambulatory Visit (INDEPENDENT_AMBULATORY_CARE_PROVIDER_SITE_OTHER): Payer: 59 | Admitting: Obstetrics & Gynecology

## 2017-10-19 VITALS — BP 101/55 | HR 70 | Wt 185.0 lb

## 2017-10-19 DIAGNOSIS — O24410 Gestational diabetes mellitus in pregnancy, diet controlled: Secondary | ICD-10-CM | POA: Diagnosis not present

## 2017-10-19 DIAGNOSIS — Z113 Encounter for screening for infections with a predominantly sexual mode of transmission: Secondary | ICD-10-CM

## 2017-10-19 DIAGNOSIS — B3749 Other urogenital candidiasis: Secondary | ICD-10-CM

## 2017-10-19 DIAGNOSIS — O09513 Supervision of elderly primigravida, third trimester: Secondary | ICD-10-CM

## 2017-10-19 MED ORDER — FLUCONAZOLE 150 MG PO TABS: ORAL_TABLET | ORAL | 0 refills | Status: DC

## 2017-10-19 MED FILL — FLUCONAZOLE 150 MG TABLET: 150 | 9 days supply | Qty: 3 | Fill #0

## 2017-10-21 DIAGNOSIS — B3749 Other urogenital candidiasis: Secondary | ICD-10-CM | POA: Insufficient documentation

## 2017-10-21 HISTORY — DX: Other urogenital candidiasis: B37.49

## 2017-10-21 LAB — URINE CYTOLOGY ANCILLARY ONLY
Chlamydia: NEGATIVE
NEISSERIA GONORRHEA: NEGATIVE
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
## 2017-10-22 ENCOUNTER — Ambulatory Visit (INDEPENDENT_AMBULATORY_CARE_PROVIDER_SITE_OTHER): Payer: 59 | Admitting: *Deleted

## 2017-10-22 VITALS — HR 75

## 2017-10-22 DIAGNOSIS — O24410 Gestational diabetes mellitus in pregnancy, diet controlled: Secondary | ICD-10-CM

## 2017-10-22 LAB — CULTURE, BETA STREP (GROUP B ONLY)
MICRO NUMBER: 90056463
SPECIMEN QUALITY:: ADEQUATE

## 2017-10-27 ENCOUNTER — Other Ambulatory Visit: Payer: Self-pay | Admitting: Obstetrics & Gynecology

## 2017-10-27 ENCOUNTER — Ambulatory Visit (INDEPENDENT_AMBULATORY_CARE_PROVIDER_SITE_OTHER): Payer: 59 | Admitting: Obstetrics & Gynecology

## 2017-10-27 VITALS — BP 88/59 | HR 88 | Wt 186.0 lb

## 2017-10-27 DIAGNOSIS — O09513 Supervision of elderly primigravida, third trimester: Secondary | ICD-10-CM

## 2017-10-27 DIAGNOSIS — B3749 Other urogenital candidiasis: Secondary | ICD-10-CM

## 2017-10-27 DIAGNOSIS — O24419 Gestational diabetes mellitus in pregnancy, unspecified control: Secondary | ICD-10-CM | POA: Diagnosis not present

## 2017-10-28 MED ORDER — TRIAMCINOLONE ACETONIDE 0.5 % EX OINT: TOPICAL_OINTMENT | CUTANEOUS | 0 refills | Status: DC

## 2017-10-28 MED ORDER — TERCONAZOLE 0.4 % VA CREA: 1.0000 | TOPICAL_CREAM | Freq: Every day | VAGINAL | 0 refills | Status: DC

## 2017-10-28 MED FILL — TRIAMCINOLONE 0.5% OINTMENT: 0.5 | 8 days supply | Qty: 30 | Fill #0

## 2017-10-28 MED FILL — TERCONAZOLE 0.4% VAG CREAM: 0.4 | 5 days supply | Qty: 45 | Fill #0
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
## 2017-10-30 ENCOUNTER — Ambulatory Visit (INDEPENDENT_AMBULATORY_CARE_PROVIDER_SITE_OTHER): Payer: 59 | Admitting: *Deleted

## 2017-10-30 VITALS — BP 100/56

## 2017-10-30 DIAGNOSIS — O09513 Supervision of elderly primigravida, third trimester: Secondary | ICD-10-CM

## 2017-11-02 ENCOUNTER — Encounter (HOSPITAL_COMMUNITY)
Admission: RE | Admit: 2017-11-02 | Discharge: 2017-11-02 | Disposition: A | Discharge status: Home / Self Care | Payer: 59 | Source: Ambulatory Visit | Attending: Obstetrics & Gynecology | Admitting: Obstetrics & Gynecology

## 2017-11-02 DIAGNOSIS — O329XX0 Maternal care for malpresentation of fetus, unspecified, not applicable or unspecified: Secondary | ICD-10-CM

## 2017-11-02 HISTORY — DX: Gestational diabetes mellitus in pregnancy, unspecified control: O24.419

## 2017-11-02 LAB — CBC
HEMATOCRIT: 39 % (ref 36.0–46.0)
Hemoglobin: 13.7 g/dL (ref 12.0–15.0)
MCH: 30.7 pg (ref 26.0–34.0)
MCHC: 35.1 g/dL (ref 30.0–36.0)
MCV: 87.4 fL (ref 78.0–100.0)
Platelets: 182 10*3/uL (ref 150–400)
RBC: 4.46 MIL/uL (ref 3.87–5.11)
RDW: 14.1 % (ref 11.5–15.5)
WBC: 10.8 10*3/uL — ABNORMAL HIGH (ref 4.0–10.5)

## 2017-11-02 LAB — ABO/RH: ABO/RH(D): O POS

## 2017-11-02 NOTE — Patient Instructions (Signed)
Caitlin Cain  11/02/2017   Your procedure is scheduled on:  11/03/2017  Enter through the Main Entrance of [facility] [place] up the phone at the desk and dial [phone]  Call this number if you have problems the morning of surgery:[phone]  Remember:   Do not eat food:After Midnight.  Do not drink clear liquids: After Midnight.  Take these medicines the morning of surgery with A SIP OF WATER: none   Do not wear jewelry, make-up or nail polish.  Do not wear lotions, powders, or perfumes. Do not wear deodorant.  Do not shave 48 hours prior to surgery.  Do not bring valuables to the hospital.  [name] is not   responsible for any belongings or valuables brought to the hospital.  Contacts, dentures or bridgework may not be worn into surgery.  Leave suitcase in the car. After surgery it may be brought to your room.  For patients admitted to the hospital, checkout time is 11:00 AM the day of              discharge.    N/A   Please read over the following fact sheets that you were given:   Surgical Site Infection Prevention
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
## 2017-11-02 NOTE — H&P (Signed)
Caitlin Cain is a 42 y.o. female presenting for primary cesarean section for large cervical fibroid obstructing pelvic inlet and transverse lie.  Pt has AMA and has well controlled GDM.  OB History    Gravida Para Term Preterm AB Living   1         0   SAB TAB Ectopic Multiple Live Births                 Past Medical History:  Diagnosis Date  . Abnormal Pap smear of cervix   . Anxiety    situational  . Gestational diabetes   . Scoliosis   . Skin cancer   . Uterine fibroid   . Vaginal Pap smear, abnormal    Past Surgical History:  Procedure Laterality Date  . lymph node removal     neck benign   Family History: family history includes Breast cancer in her maternal aunt, maternal grandmother, and mother; Cancer in her maternal grandfather; Heart disease in her father. Social History:  reports that  has never smoked. she has never used smokeless tobacco. She reports that she does not drink alcohol or use drugs.     Maternal Diabetes: Yes:  Diabetes Type:  Diet controlled Genetic Screening: Normal Maternal Ultrasounds/Referrals: Normal Fetal Ultrasounds or other Referrals:  None Maternal Substance Abuse:  No Significant Maternal Medications:  None Significant Maternal Lab Results:  None Other Comments:  None  Review of Systems  Constitutional: Negative.   Respiratory: Negative.   Cardiovascular: Negative.   Gastrointestinal: Negative.   Genitourinary: Negative.   Neurological: Negative.   Psychiatric/Behavioral: Negative.    History   Last menstrual period 02/03/2017. Exam Physical Exam  Vitals reviewed. Constitutional: She is oriented to person, place, and time. She appears well-developed and well-nourished. No distress.  HENT:  Head: Normocephalic and atraumatic.  Eyes: Conjunctivae are normal.  Neck: Neck supple. No thyromegaly present.  Cardiovascular: Normal rate and regular rhythm.  Respiratory: Effort normal and breath sounds normal.  GI: Soft.  There is no tenderness.  Musculoskeletal: Normal range of motion. She exhibits no tenderness.  Neurological: She is alert and oriented to person, place, and time.  Skin: Skin is warm and dry.  Psychiatric: She has a normal mood and affect.    Vitals:   11/03/17 0801  BP: 117/60  Pulse: 94  Resp: 18  Temp: 98.5 F (36.9 C)  TempSrc: Oral  Weight: 184 lb 11.2 oz (83.8 kg)  Height: 5\' 9"  (1.753 m)    Prenatal labs: ABO, Rh: --/--/O POS, O POS (01/28 0950) Antibody: NEG (01/28 0950) Rubella:  non reactive RPR: NON-REACTIVE (11/16 0843)  HBsAg:   non reactive HIV: NON-REACTIVE (11/16 0843)  GBS:   GBS negative  Assessment/Plan: 42 yo G1P0 at 39 weeks presents for primary c/s due to transverse lie and large cervical fibroid obstructing the pelvic outlet.  Pt has had asymptomatic pedunculated fibroid/  Plan is for primary classical c section above cervical fibroid.  If fundal myoma can be safely removed, will proceed  With myomectomy/    The risks of cesarean section discussed with the patient included but were not limited to: bleeding which may require transfusion or reoperation; infection which may require antibiotics; injury to bowel, bladder, ureters or other surrounding organs; injury to the fetus; need for additional procedures including hysterectomy in the event of a life-threatening hemorrhage; placental abnormalities wth subsequent pregnancies, incisional problems, thromboembolic phenomenon and other postoperative/anesthesia complications. The patient concurred with the proposed  plan, giving informed written consent for the procedure.     Silas Sacramento 11/02/2017, 5:56 PM

## 2017-11-03 ENCOUNTER — Encounter (HOSPITAL_COMMUNITY)
Admission: AD | Disposition: A | Discharge status: Home / Self Care | Payer: Self-pay | Source: Ambulatory Visit | Attending: Obstetrics & Gynecology

## 2017-11-03 ENCOUNTER — Encounter (HOSPITAL_COMMUNITY): Payer: Self-pay | Admitting: Anesthesiology

## 2017-11-03 ENCOUNTER — Inpatient Hospital Stay (HOSPITAL_COMMUNITY): Payer: 59

## 2017-11-03 ENCOUNTER — Inpatient Hospital Stay (HOSPITAL_COMMUNITY)
Admission: AD | Admit: 2017-11-03 | Discharge: 2017-11-06 | DRG: 788 | Disposition: A | Discharge status: Home / Self Care | Payer: 59 | Source: Ambulatory Visit | Attending: Obstetrics & Gynecology | Admitting: Obstetrics & Gynecology

## 2017-11-03 DIAGNOSIS — O3413 Maternal care for benign tumor of corpus uteri, third trimester: Principal | ICD-10-CM | POA: Diagnosis present

## 2017-11-03 DIAGNOSIS — Z3A38 38 weeks gestation of pregnancy: Secondary | ICD-10-CM

## 2017-11-03 DIAGNOSIS — O329XX0 Maternal care for malpresentation of fetus, unspecified, not applicable or unspecified: Secondary | ICD-10-CM

## 2017-11-03 DIAGNOSIS — O322XX0 Maternal care for transverse and oblique lie, not applicable or unspecified: Secondary | ICD-10-CM | POA: Diagnosis not present

## 2017-11-03 DIAGNOSIS — O24420 Gestational diabetes mellitus in childbirth, diet controlled: Secondary | ICD-10-CM | POA: Diagnosis present

## 2017-11-03 DIAGNOSIS — D252 Subserosal leiomyoma of uterus: Secondary | ICD-10-CM | POA: Diagnosis present

## 2017-11-03 DIAGNOSIS — Z85828 Personal history of other malignant neoplasm of skin: Secondary | ICD-10-CM

## 2017-11-03 DIAGNOSIS — M419 Scoliosis, unspecified: Secondary | ICD-10-CM | POA: Diagnosis present

## 2017-11-03 DIAGNOSIS — Z98891 History of uterine scar from previous surgery: Secondary | ICD-10-CM

## 2017-11-03 LAB — RPR: RPR: NONREACTIVE

## 2017-11-03 LAB — GLUCOSE, CAPILLARY
GLUCOSE-CAPILLARY: 93 mg/dL (ref 65–99)
Glucose-Capillary: 82 mg/dL (ref 65–99)

## 2017-11-03 LAB — PREPARE RBC (CROSSMATCH)

## 2017-11-03 SURGERY — Surgical Case
Anesthesia: Spinal | Site: Abdomen | Wound class: Clean Contaminated

## 2017-11-03 MED ORDER — LACTATED RINGERS IV SOLN
INTRAVENOUS | Status: DC
  Administered 2017-11-03 (×3): via INTRAVENOUS

## 2017-11-03 MED ORDER — NALBUPHINE HCL 10 MG/ML IJ SOLN
5.0000 mg | Freq: Once | INTRAMUSCULAR | Status: DC | PRN
Start: 2017-11-03 — End: 2017-11-06

## 2017-11-03 MED ORDER — BUPIVACAINE IN DEXTROSE 0.75-8.25 % IT SOLN
INTRATHECAL | Status: DC | PRN
  Administered 2017-11-03: 2 mL via INTRATHECAL

## 2017-11-03 MED ORDER — OXYTOCIN 10 UNIT/ML IJ SOLN
INTRAVENOUS | Status: DC | PRN
  Administered 2017-11-03: 40 [IU] via INTRAVENOUS

## 2017-11-03 MED ORDER — LACTATED RINGERS IV SOLN
INTRAVENOUS | Status: DC
  Administered 2017-11-03 – 2017-11-04 (×2): via INTRAVENOUS

## 2017-11-03 MED ORDER — MORPHINE SULFATE (PF) 0.5 MG/ML IJ SOLN
INTRAMUSCULAR | Status: AC
  Filled 2017-11-03: qty 10

## 2017-11-03 MED ORDER — STERILE WATER FOR IRRIGATION IR SOLN
Status: DC | PRN
  Administered 2017-11-03: 1000 mL

## 2017-11-03 MED ORDER — NALBUPHINE HCL 10 MG/ML IJ SOLN: 5.0000 mg | INTRAMUSCULAR | Status: DC | PRN

## 2017-11-03 MED ORDER — DIPHENHYDRAMINE HCL 25 MG PO CAPS: 25.0000 mg | ORAL_CAPSULE | Freq: Four times a day (QID) | ORAL | Status: DC | PRN

## 2017-11-03 MED ORDER — FENTANYL CITRATE (PF) 100 MCG/2ML IJ SOLN
25.0000 ug | INTRAMUSCULAR | Status: DC | PRN
  Administered 2017-11-03: 25 ug via INTRAVENOUS
  Administered 2017-11-03: 50 ug via INTRAVENOUS
  Administered 2017-11-03: 25 ug via INTRAVENOUS

## 2017-11-03 MED ORDER — NALOXONE HCL 0.4 MG/ML IJ SOLN: 0.4000 mg | INTRAMUSCULAR | Status: DC | PRN

## 2017-11-03 MED ORDER — NALOXONE HCL 4 MG/10ML IJ SOLN: 1.0000 ug/kg/h | INTRAMUSCULAR | Status: DC | PRN

## 2017-11-03 MED ORDER — MEASLES, MUMPS & RUBELLA VAC ~~LOC~~ INJ: 0.5000 mL | INJECTION | Freq: Once | SUBCUTANEOUS | Status: DC

## 2017-11-03 MED ORDER — IBUPROFEN 600 MG PO TABS
600.0000 mg | ORAL_TABLET | Freq: Four times a day (QID) | ORAL | Status: DC
  Administered 2017-11-03 – 2017-11-06 (×12): 600 mg via ORAL
  Filled 2017-11-03 (×11): qty 1

## 2017-11-03 MED ORDER — SIMETHICONE 80 MG PO CHEW
80.0000 mg | CHEWABLE_TABLET | ORAL | Status: DC | PRN
  Administered 2017-11-04: 80 mg via ORAL
  Filled 2017-11-03: qty 1

## 2017-11-03 MED ORDER — MEPERIDINE HCL 25 MG/ML IJ SOLN: 6.2500 mg | INTRAMUSCULAR | Status: DC | PRN

## 2017-11-03 MED ORDER — METHYLERGONOVINE MALEATE 0.2 MG/ML IJ SOLN
INTRAMUSCULAR | Status: DC | PRN
  Administered 2017-11-03: 0.2 mg via INTRAMUSCULAR

## 2017-11-03 MED ORDER — OXYCODONE-ACETAMINOPHEN 5-325 MG PO TABS
2.0000 | ORAL_TABLET | ORAL | Status: DC | PRN
  Administered 2017-11-03: 1 via ORAL
  Administered 2017-11-04 (×2): 2 via ORAL
  Administered 2017-11-04: 1 via ORAL
  Administered 2017-11-04 – 2017-11-06 (×9): 2 via ORAL
  Filled 2017-11-03 (×13): qty 2

## 2017-11-03 MED ORDER — LACTATED RINGERS IV SOLN: INTRAVENOUS | Status: DC

## 2017-11-03 MED ORDER — OXYTOCIN 40 UNITS IN LACTATED RINGERS INFUSION - SIMPLE MED: 2.5000 [IU]/h | INTRAVENOUS | Status: AC

## 2017-11-03 MED ORDER — MORPHINE SULFATE (PF) 0.5 MG/ML IJ SOLN
INTRAMUSCULAR | Status: DC | PRN
  Administered 2017-11-03: .2 mg via EPIDURAL

## 2017-11-03 MED ORDER — METOCLOPRAMIDE HCL 5 MG/ML IJ SOLN: 10.0000 mg | Freq: Once | INTRAMUSCULAR | Status: DC | PRN

## 2017-11-03 MED ORDER — SODIUM CHLORIDE 0.9 % IV SOLN: Freq: Once | INTRAVENOUS | Status: DC

## 2017-11-03 MED ORDER — DEXAMETHASONE SODIUM PHOSPHATE 10 MG/ML IJ SOLN
INTRAMUSCULAR | Status: AC
  Filled 2017-11-03: qty 1

## 2017-11-03 MED ORDER — SODIUM CHLORIDE 0.9% FLUSH: 3.0000 mL | INTRAVENOUS | Status: DC | PRN

## 2017-11-03 MED ORDER — MENTHOL 3 MG MT LOZG: 1.0000 | LOZENGE | OROMUCOSAL | Status: DC | PRN

## 2017-11-03 MED ORDER — NALBUPHINE HCL 10 MG/ML IJ SOLN: 5.0000 mg | Freq: Once | INTRAMUSCULAR | Status: DC | PRN

## 2017-11-03 MED ORDER — METOCLOPRAMIDE HCL 5 MG/ML IJ SOLN
INTRAMUSCULAR | Status: AC
  Filled 2017-11-03: qty 2

## 2017-11-03 MED ORDER — SODIUM CHLORIDE 0.9 % IR SOLN
Status: DC | PRN
  Administered 2017-11-03: 1000 mL

## 2017-11-03 MED ORDER — METOCLOPRAMIDE HCL 5 MG/ML IJ SOLN
INTRAMUSCULAR | Status: DC | PRN
  Administered 2017-11-03: 10 mg via INTRAVENOUS

## 2017-11-03 MED ORDER — PRENATAL MULTIVITAMIN CH
1.0000 | ORAL_TABLET | Freq: Every day | ORAL | Status: DC
  Administered 2017-11-04 – 2017-11-05 (×2): 1 via ORAL
  Filled 2017-11-03 (×3): qty 1

## 2017-11-03 MED ORDER — FENTANYL CITRATE (PF) 100 MCG/2ML IJ SOLN
INTRAMUSCULAR | Status: DC | PRN
  Administered 2017-11-03: 20 ug via INTRATHECAL

## 2017-11-03 MED ORDER — SIMETHICONE 80 MG PO CHEW
80.0000 mg | CHEWABLE_TABLET | ORAL | Status: DC
  Administered 2017-11-03 – 2017-11-05 (×3): 80 mg via ORAL
  Filled 2017-11-03 (×3): qty 1

## 2017-11-03 MED ORDER — SCOPOLAMINE 1 MG/3DAYS TD PT72
1.0000 | MEDICATED_PATCH | Freq: Once | TRANSDERMAL | Status: DC
  Administered 2017-11-03: 1.5 mg via TRANSDERMAL
  Filled 2017-11-03: qty 1

## 2017-11-03 MED ORDER — BUPIVACAINE IN DEXTROSE 0.75-8.25 % IT SOLN
INTRATHECAL | Status: AC
  Filled 2017-11-03: qty 2

## 2017-11-03 MED ORDER — DIBUCAINE 1 % RE OINT: 1.0000 "application " | TOPICAL_OINTMENT | RECTAL | Status: DC | PRN

## 2017-11-03 MED ORDER — ONDANSETRON HCL 4 MG/2ML IJ SOLN
INTRAMUSCULAR | Status: DC | PRN
  Administered 2017-11-03: 4 mg via INTRAVENOUS

## 2017-11-03 MED ORDER — PHENYLEPHRINE 8 MG IN D5W 100 ML (0.08MG/ML) PREMIX OPTIME
INJECTION | INTRAVENOUS | Status: AC
  Filled 2017-11-03: qty 100

## 2017-11-03 MED ORDER — WITCH HAZEL-GLYCERIN EX PADS: 1.0000 "application " | MEDICATED_PAD | CUTANEOUS | Status: DC | PRN

## 2017-11-03 MED ORDER — COCONUT OIL OIL
1.0000 "application " | TOPICAL_OIL | Status: DC | PRN
  Filled 2017-11-03 (×2): qty 120

## 2017-11-03 MED ORDER — SENNOSIDES-DOCUSATE SODIUM 8.6-50 MG PO TABS
2.0000 | ORAL_TABLET | ORAL | Status: DC
  Administered 2017-11-03 – 2017-11-05 (×3): 2 via ORAL
  Filled 2017-11-03 (×3): qty 2

## 2017-11-03 MED ORDER — DEXAMETHASONE SODIUM PHOSPHATE 4 MG/ML IJ SOLN
INTRAMUSCULAR | Status: DC | PRN
  Administered 2017-11-03: 4 mg via INTRAVENOUS

## 2017-11-03 MED ORDER — LACTATED RINGERS IV SOLN
INTRAVENOUS | Status: DC | PRN
  Administered 2017-11-03: 10:00:00 via INTRAVENOUS

## 2017-11-03 MED ORDER — TETANUS-DIPHTH-ACELL PERTUSSIS 5-2.5-18.5 LF-MCG/0.5 IM SUSP: 0.5000 mL | Freq: Once | INTRAMUSCULAR | Status: DC

## 2017-11-03 MED ORDER — CEFAZOLIN SODIUM-DEXTROSE 2-4 GM/100ML-% IV SOLN
2.0000 g | INTRAVENOUS | Status: AC
  Administered 2017-11-03: 2 g via INTRAVENOUS
  Filled 2017-11-03: qty 100

## 2017-11-03 MED ORDER — ZOLPIDEM TARTRATE 5 MG PO TABS: 5.0000 mg | ORAL_TABLET | Freq: Every evening | ORAL | Status: DC | PRN

## 2017-11-03 MED ORDER — FENTANYL CITRATE (PF) 100 MCG/2ML IJ SOLN
INTRAMUSCULAR | Status: AC
  Administered 2017-11-03: 25 ug via INTRAVENOUS
  Filled 2017-11-03: qty 2

## 2017-11-03 MED ORDER — FENTANYL CITRATE (PF) 100 MCG/2ML IJ SOLN
INTRAMUSCULAR | Status: AC
  Filled 2017-11-03: qty 2

## 2017-11-03 MED ORDER — ONDANSETRON HCL 4 MG/2ML IJ SOLN
4.0000 mg | Freq: Three times a day (TID) | INTRAMUSCULAR | Status: DC | PRN
  Administered 2017-11-03: 4 mg via INTRAVENOUS
  Filled 2017-11-03: qty 2

## 2017-11-03 MED ORDER — ONDANSETRON HCL 4 MG/2ML IJ SOLN
INTRAMUSCULAR | Status: AC
  Filled 2017-11-03: qty 2

## 2017-11-03 MED ORDER — SOD CITRATE-CITRIC ACID 500-334 MG/5ML PO SOLN
30.0000 mL | Freq: Once | ORAL | Status: AC
  Administered 2017-11-03: 30 mL via ORAL
  Filled 2017-11-03: qty 15

## 2017-11-03 MED ORDER — OXYTOCIN 10 UNIT/ML IJ SOLN
INTRAMUSCULAR | Status: AC
  Filled 2017-11-03: qty 4

## 2017-11-03 MED ORDER — PHENYLEPHRINE 8 MG IN D5W 100 ML (0.08MG/ML) PREMIX OPTIME
INJECTION | INTRAVENOUS | Status: DC | PRN
  Administered 2017-11-03: 60 ug/min via INTRAVENOUS

## 2017-11-03 MED ORDER — METHYLERGONOVINE MALEATE 0.2 MG/ML IJ SOLN
INTRAMUSCULAR | Status: AC
  Filled 2017-11-03: qty 1

## 2017-11-03 MED ORDER — DIPHENHYDRAMINE HCL 25 MG PO CAPS: 25.0000 mg | ORAL_CAPSULE | ORAL | Status: DC | PRN

## 2017-11-03 MED ORDER — DIPHENHYDRAMINE HCL 50 MG/ML IJ SOLN: 12.5000 mg | INTRAMUSCULAR | Status: DC | PRN

## 2017-11-03 MED ORDER — ACETAMINOPHEN 325 MG PO TABS
650.0000 mg | ORAL_TABLET | ORAL | Status: DC | PRN
Start: 2017-11-03 — End: 2017-11-06
  Administered 2017-11-03: 650 mg via ORAL
  Filled 2017-11-03: qty 2

## 2017-11-03 SURGICAL SUPPLY — 30 items
BARRIER ADHS 3X4 INTERCEED (GAUZE/BANDAGES/DRESSINGS) ×3 IMPLANT
CHLORAPREP W/TINT 26ML (MISCELLANEOUS) ×3 IMPLANT
CLAMP CORD UMBIL (MISCELLANEOUS) ×3 IMPLANT
CLOTH BEACON ORANGE TIMEOUT ST (SAFETY) ×3 IMPLANT
DRAPE C SECTION CLR SCREEN (DRAPES) ×3 IMPLANT
DRSG OPSITE POSTOP 4X10 (GAUZE/BANDAGES/DRESSINGS) ×3 IMPLANT
ELECT REM PT RETURN 9FT ADLT (ELECTROSURGICAL) ×3
ELECTRODE REM PT RTRN 9FT ADLT (ELECTROSURGICAL) ×1 IMPLANT
GLOVE BIO SURGEON STRL SZ7 (GLOVE) ×3 IMPLANT
GLOVE BIOGEL PI IND STRL 7.0 (GLOVE) ×2 IMPLANT
GLOVE BIOGEL PI INDICATOR 7.0 (GLOVE) ×4
GOWN STRL REUS W/TWL LRG LVL3 (GOWN DISPOSABLE) ×6 IMPLANT
NEEDLE HYPO 25X5/8 SAFETYGLIDE (NEEDLE) ×3 IMPLANT
NS IRRIG 1000ML POUR BTL (IV SOLUTION) ×3 IMPLANT
PACK C SECTION WH (CUSTOM PROCEDURE TRAY) ×3 IMPLANT
PAD ABD 8X7 1/2 STERILE (GAUZE/BANDAGES/DRESSINGS) ×3 IMPLANT
PAD OB MATERNITY 4.3X12.25 (PERSONAL CARE ITEMS) ×3 IMPLANT
PENCIL SMOKE EVAC W/HOLSTER (ELECTROSURGICAL) ×3 IMPLANT
RTRCTR C-SECT PINK 25CM LRG (MISCELLANEOUS) ×3 IMPLANT
SPONGE GAUZE 4X4 12PLY STER LF (GAUZE/BANDAGES/DRESSINGS) ×6 IMPLANT
SPONGE LAP 18X18 X RAY DECT (DISPOSABLE) ×3 IMPLANT
STAPLER VISISTAT 35W (STAPLE) ×3 IMPLANT
SUT PLAIN 2 0 XLH (SUTURE) ×3 IMPLANT
SUT VIC AB 0 CTX 36 (SUTURE) ×10
SUT VIC AB 0 CTX36XBRD ANBCTRL (SUTURE) ×5 IMPLANT
SUT VIC AB 4-0 KS 27 (SUTURE) ×3 IMPLANT
SYR KIT LINE DRAW 1CC W/FILTR (LINER) ×3 IMPLANT
TAPE CLOTH SURG 4X10 WHT LF (GAUZE/BANDAGES/DRESSINGS) ×3 IMPLANT
TOWEL OR 17X24 6PK STRL BLUE (TOWEL DISPOSABLE) ×3 IMPLANT
TRAY FOLEY BAG SILVER LF 14FR (SET/KITS/TRAYS/PACK) ×3 IMPLANT

## 2017-11-03 NOTE — Addendum Note (Signed)
Addendum  created 11/03/17 1403 by Ignacia Bayley, CRNA   Delete clinical note, Sign clinical note

## 2017-11-03 NOTE — Op Note (Signed)
Luvada Salamone 11/03/2017  11:23 AM  PATIENT:  Caitlin Cain  42 y.o. female  PRE-OPERATIVE DIAGNOSIS:  large cervical fibroid, transverse fetel lie, large pedunculated fundal fibroid  POST-OPERATIVE DIAGNOSIS:  large cervical fibroid, transverse fetel lie, large pedunculated fundal fibroid  PROCEDURE:  Procedure(s): PRIMARY CESAREAN SECTION (N/A)--CLASSICAL  SURGEON:  Surgeon(s) and Role:    * Sharmel Ballantine, Fredderick Phenix, MD - Primary    * Constant, Peggy, MD - Assisting  ANESTHESIA:   CSE  EBL:  1239 mL   BLOOD ADMINISTERED:none  DRAINS: none   LOCAL MEDICATIONS USED:  NONE  SPECIMEN:  No Specimen  Pt taking home placenta  DISPOSITION OF SPECIMEN:  N/A  COUNTS:  YES  TOURNIQUET:  * No tourniquets in log *  DICTATION: .Note written in EPIC  PLAN OF CARE: Admit to inpatient   PATIENT DISPOSITION:  PACU - hemodynamically stable.:   INDICATIONS: Beadie Matsunaga is a 42 y.o. G1P0 at [redacted]w[redacted]d with large cervical fibroid extending into LUS.  Vertical uterine incision needed to avoid cutting into the LUS fibroid.  The risks of cesarean section discussed with the patient included but were not limited to: bleeding which may require transfusion or reoperation; infection which may require antibiotics; injury to bowel, bladder, ureters or other surrounding organs; injury to the fetus; need for additional procedures including hysterectomy in the event of a life-threatening hemorrhage; placental abnormalities wth subsequent pregnancies, incisional problems, thromboembolic phenomenon and other postoperative/anesthesia complications. The patient concurred with the proposed plan, giving informed written consent for the procedure.    FINDINGS:  Viable female  infant in transverse back up presentation, 9,9 Apgars, weight to be determined in 1 hour, clear amniotic fluid.  Intact placenta, three vessel cord.  Uterus with 12 cm LUS fibroid and 7 cm subserosal fibroid with large broad base  stalk, normal ovaries and fallopian tubes.  COMPLICATIONS: None immediate  PROCEDURE IN DETAIL:  The patient received intravenous antibiotics and had sequential compression devices applied to her lower extremities.  Combined spinal epidural anesthesia was administered and was found to be adequate. She was then placed in a dorsal supine position with a leftward tilt, and prepped and draped in a sterile manner.  A foley catheter was placed into her bladder and attached to constant gravity.  Bedside US was used to map out fibroids and determine best skin and uterine incision.  After an adequate timeout was performed, a vertical skin incision was made with scalpel and carried through to the underlying layer of fascia. The fascia was incised in the midline and this incision was extended superiorly and inferiorly using  A Euphemia Lingerfelt clamp and Bovie. The rectus muscles were separated in the midline bluntly and the peritoneum was entered bluntly.   A vertical hysterotomy was made with a scalpel above the LUS fibroid and extended cephalad with bandage scissors. The infant was successfully delivered, and cord was clamped and cut after one minute and infant was handed over to awaiting neonatology team.  The placenta delivered manually intact with three-vessel cord. The uterus was cleared of clot and debris.  The hysterotomy was closed with 0 Monocryl.  A second imbricating suture of 0-Monocryl was used to reinforce the incision and aid in hemostasis.  2-0 Monocryl was used to re approximate the serosa.  Intercede was placed over the uterine incision to reduce scar formation.  The peritoneum and rectus muscles were noted to be hemostatic.  The fascia, rectus muscles and peritoneum was closed in a bulk  closure using a 0 loop PDS.  The knot was buried in the subcutaneous tissue.  The subcutaneous tissue was re approximated with 2-0 plain suture.  The skin was closed with staples.  Pressure dressing was placed on the abdomen.  Pt  tolerated the procedure will.  All counts were correct x2.  Pt went to the recovery room in stable condition.

## 2017-11-03 NOTE — Brief Op Note (Signed)
11/03/2017  11:23 AM  PATIENT:  Caitlin Cain  42 y.o. female  PRE-OPERATIVE DIAGNOSIS:  large cervical fibroid, transverse fetel lie, large pedunculated fundal fibroid  POST-OPERATIVE DIAGNOSIS:  large cervical fibroid, transverse fetel lie, large pedunculated fundal fibroid  PROCEDURE:  Procedure(s): PRIMARY CESAREAN SECTION (N/A)  SURGEON:  Surgeon(s) and Role:    * Guss Bunde, MD - Primary    * Constant, Peggy, MD - Assisting  ANESTHESIA:   CSE  EBL:  1239 mL   BLOOD ADMINISTERED:none  DRAINS: none   LOCAL MEDICATIONS USED:  NONE  SPECIMEN:  No Specimen  Pt taking home placenta  DISPOSITION OF SPECIMEN:  N/A  COUNTS:  YES  TOURNIQUET:  * No tourniquets in log *  DICTATION: .Note written in EPIC  PLAN OF CARE: Admit to inpatient   PATIENT DISPOSITION:  PACU - hemodynamically stable.   Delay start of Pharmacological VTE agent (>24hrs) due to surgical blood loss or risk of bleeding: not applicable

## 2017-11-03 NOTE — Progress Notes (Signed)
RN removed epidural catheter at 1225. [name] [place], [place]  11/03/2017 12:29 PM

## 2017-11-03 NOTE — Anesthesia Procedure Notes (Signed)
Spinal  Patient location during procedure: OR Start time: 11/03/2017 9:26 AM Staffing Anesthesiologist: Josephine Igo, MD Performed: anesthesiologist  Preanesthetic Checklist Completed: patient identified, site marked, surgical consent, pre-op evaluation, timeout performed, IV checked, risks and benefits discussed and monitors and equipment checked Spinal Block Patient position: sitting Prep: site prepped and draped and DuraPrep Patient monitoring: cardiac monitor, continuous pulse ox, blood pressure and heart rate Approach: midline Location: L3-4 Injection technique: catheter Needle Needle type: Tuohy, Sprotte and Spinocan  Needle gauge: 25 G Needle length: 12.7 cm Needle insertion depth: 5 cm Catheter type: closed end flexible Catheter size: 19 g Assessment Sensory level: T4 Additional Notes Epidural performed as above. SAB performed through epidural needle. CSF clear, free flow, no heme or paresthesias. LA + Narcotics injected through spinal needle. Spinal needle withdrawn and epidural catheter threaded 5cm into the epidural space. Epidural needle withdrawn and sterile dressing applied. Patient then placed supine with LUD. Patient tolerated procedure well.

## 2017-11-03 NOTE — Addendum Note (Signed)
Addendum  created 11/03/17 1358 by Ignacia Bayley, CRNA   Sign clinical note

## 2017-11-03 NOTE — Anesthesia Preprocedure Evaluation (Addendum)
Anesthesia Evaluation  Patient identified by MRN, date of birth, ID band Patient awake    Reviewed: Allergy & Precautions, NPO status , Patient's Chart, lab work & pertinent test results  Airway Mallampati: II  TM Distance: >3 FB Neck ROM: Full    Dental  (+) Teeth Intact   Pulmonary neg pulmonary ROS,    Pulmonary exam normal breath sounds clear to auscultation       Cardiovascular negative cardio ROS Normal cardiovascular exam Rhythm:Regular Rate:Normal     Neuro/Psych PSYCHIATRIC DISORDERS Anxiety negative neurological ROS     GI/Hepatic negative GI ROS, Neg liver ROS,   Endo/Other  diabetes  Renal/GU negative Renal ROS  negative genitourinary   Musculoskeletal AMA Malpresentation- Transverse lie Uterine leiomyomas Cervical fibroid   Abdominal Uterine fibroid palpable left upper quadrant  Peds  Hematology negative hematology ROS (+)   Anesthesia Other Findings   Reproductive/Obstetrics                            Anesthesia Physical Anesthesia Plan  ASA: II  Anesthesia Plan: Combined Spinal and Epidural   Post-op Pain Management:    Induction:   PONV Risk Score and Plan: 4 or greater and Scopolamine patch - Pre-op, Dexamethasone, Ondansetron and Treatment may vary due to age or medical condition  Airway Management Planned: Natural Airway  Additional Equipment:   Intra-op Plan:   Post-operative Plan:   Informed Consent: I have reviewed the patients History and Physical, chart, labs and discussed the procedure including the risks, benefits and alternatives for the proposed anesthesia with the patient or authorized representative who has indicated his/her understanding and acceptance.   Dental advisory given  Plan Discussed with: CRNA, Anesthesiologist and Surgeon  Anesthesia Plan Comments:         Anesthesia Quick Evaluation

## 2017-11-03 NOTE — Anesthesia Postprocedure Evaluation (Signed)
Anesthesia Post Note  Patient: Caitlin Cain  Procedure(s) Performed: PRIMARY CESAREAN SECTION (N/A Abdomen)     Patient location during evaluation: Mother Baby Anesthesia Type: Combined Spinal/Epidural Level of consciousness: awake and alert Pain management: pain level controlled Vital Signs Assessment: post-procedure vital signs reviewed and stable Respiratory status: spontaneous breathing, nonlabored ventilation and respiratory function stable Cardiovascular status: stable Postop Assessment: no headache, no backache, epidural receding, patient able to bend at knees, no apparent nausea or vomiting and adequate PO intake Anesthetic complications: no    Last Vitals:  Vitals:   11/03/17 1215 11/03/17 1230  BP: 98/84 112/83  Pulse: (!) 56 (!) 51  Resp: 17 18  Temp:  36.7 C  SpO2:  98%    Last Pain:  Vitals:   11/03/17 1245  TempSrc:   PainSc: 0-No pain   Pain Goal: Patients Stated Pain Goal: 4 (11/03/17 0801)               Barkley Boards

## 2017-11-03 NOTE — Anesthesia Postprocedure Evaluation (Deleted)
Anesthesia Post Note  Patient: Caitlin Cain  Procedure(s) Performed: PRIMARY CESAREAN SECTION (N/A Abdomen)     Patient location during evaluation: [facility] Anesthesia Type: General Level of consciousness: awake Pain management: pain level controlled Vital Signs Assessment: post-procedure vital signs reviewed and stable Respiratory status: spontaneous breathing Cardiovascular status: stable Postop Assessment: patient able to bend at knees, no apparent nausea or vomiting and adequate PO intake Anesthetic complications: no    Last Vitals:  Vitals:   11/03/17 1215 11/03/17 1230  BP: 98/84 112/83  Pulse: (!) 56 (!) 51  Resp: 17 18  Temp:  36.7 C  SpO2:  98%    Last Pain:  Vitals:   11/03/17 1245  TempSrc:   PainSc: 0-No pain   Pain Goal: Patients Stated Pain Goal: 4 (11/03/17 0801)               Barkley Boards

## 2017-11-03 NOTE — Anesthesia Postprocedure Evaluation (Signed)
Anesthesia Post Note  Patient: Caitlin Cain  Procedure(s) Performed: PRIMARY CESAREAN SECTION (N/A Abdomen)     Patient location during evaluation: PACU Anesthesia Type: Spinal Level of consciousness: oriented and awake and alert Pain management: pain level controlled Vital Signs Assessment: post-procedure vital signs reviewed and stable Respiratory status: spontaneous breathing, respiratory function stable and nonlabored ventilation Cardiovascular status: blood pressure returned to baseline and stable Postop Assessment: no headache, no backache, no apparent nausea or vomiting, spinal receding and patient able to bend at knees Anesthetic complications: no    Last Vitals:  Vitals:   11/03/17 1200 11/03/17 1215  BP: (!) 94/44 98/84  Pulse:  (!) 56  Resp: 20 17  Temp:    SpO2:      Last Pain:  Vitals:   11/03/17 1230  TempSrc:   PainSc: 3    Pain Goal: Patients Stated Pain Goal: 4 (11/03/17 0801)               Granvel Proudfoot A.

## 2017-11-03 NOTE — Transfer of Care (Signed)
Immediate Anesthesia Transfer of Care Note  Patient: Caitlin Cain  Procedure(s) Performed: PRIMARY CESAREAN SECTION (N/A Abdomen)  Patient Location: PACU  Anesthesia Type:Spinal and Epidural  Level of Consciousness: awake, alert  and oriented  Airway & Oxygen Therapy: Patient Spontanous Breathing  Post-op Assessment: Report given to RN and Post -op Vital signs reviewed and stable  Post vital signs: Reviewed and stable  Last Vitals:  Vitals:   11/03/17 0801 11/03/17 1051  BP: 117/60 (!) 97/47  Pulse: 94 (!) 59  Resp: 18 (!) 0  Temp: 36.9 C   SpO2:  100%    Last Pain:  Vitals:   11/03/17 0801  TempSrc: Oral      Patients Stated Pain Goal: 4 (82/50/53 9767)  Complications: No apparent anesthesia complications

## 2017-11-04 LAB — CBC
HCT: 35.6 % — ABNORMAL LOW (ref 36.0–46.0)
HEMOGLOBIN: 12.6 g/dL (ref 12.0–15.0)
MCH: 30.9 pg (ref 26.0–34.0)
MCHC: 35.4 g/dL (ref 30.0–36.0)
MCV: 87.3 fL (ref 78.0–100.0)
Platelets: 170 10*3/uL (ref 150–400)
RBC: 4.08 MIL/uL (ref 3.87–5.11)
RDW: 14.2 % (ref 11.5–15.5)
WBC: 14.6 10*3/uL — ABNORMAL HIGH (ref 4.0–10.5)

## 2017-11-04 NOTE — Progress Notes (Signed)
Faculty Attending Note  Post Op Day 1  Subjective: Patient is feeling okay this am. She reports moderately well controlled pain on PO pain meds. She is ambulating and denies light-headedness or dizziness. She is not passing flatus and requesting prune juice to help with constipation. She is tolerating a regular diet without nausea/vomiting. Bleeding is moderate. She is attempting to breast feed. Baby is in room and doing very well.  Objective: Blood pressure (!) 89/47, pulse 60, temperature 98.8 F (37.1 C), temperature source Oral, resp. rate 17, height _0  (1.753 m), weight 184 lb 11.2 oz (83.8 kg), last menstrual period 02/03/2017, SpO2 97 %, unknown if currently breastfeeding. Temp:  [97 F (36.1 C)-98.8 F (37.1 C)] 98.8 F (37.1 C) (01/30 0235) Pulse Rate:  [51-94] 60 (01/30 0524) Resp:  [11-20] 17 (01/30 0235) BP: (84-117)/(44-84) 89/47 (01/30 0524) SpO2:  [97 %-100 %] 97 % (01/30 0235) Weight:  [184 lb 11.2 oz (83.8 kg)] 184 lb 11.2 oz (83.8 kg) (01/29 0801)  Physical Exam:  General: alert, oriented, cooperative Chest: CTAB, normal respiratory effort Heart: RRR  Abdomen: +BS, soft, appropriately tender to palpation, incision covered by dressing with no evidence of active bleeding  Uterine Fundus: unable to palpate 2/2 dressing Lochia: moderate, rubra DVT Evaluation: no evidence of DVT Extremities: no edema, no calf tenderness  UOP: foley just out, due to void, with appropriate UOP   Current Facility-Administered Medications:  .  0.9 %  sodium chloride infusion, , Intravenous, Once, Josephine Igo, MD .  acetaminophen (TYLENOL) tablet 650 mg, 650 mg, Oral, Q4H PRN, Guss Bunde, MD, 650 mg at 11/03/17 1504 .  coconut oil, 1 application, Topical, PRN, Guss Bunde, MD .  witch hazel-glycerin (TUCKS) pad 1 application, 1 application, Topical, PRN **AND** dibucaine (NUPERCAINAL) 1 % rectal ointment 1 application, 1 application, Rectal, PRN, Guss Bunde, MD .   diphenhydrAMINE (BENADRYL) injection 12.5 mg, 12.5 mg, Intravenous, Q4H PRN **OR** diphenhydrAMINE (BENADRYL) capsule 25 mg, 25 mg, Oral, Q4H PRN, Josephine Igo, MD .  diphenhydrAMINE (BENADRYL) capsule 25 mg, 25 mg, Oral, Q6H PRN, Guss Bunde, MD .  ibuprofen (ADVIL,MOTRIN) tablet 600 mg, 600 mg, Oral, Q6H, Guss Bunde, MD, 600 mg at 11/04/17 0526 .  lactated ringers infusion, , Intravenous, Continuous, Guss Bunde, MD, Last Rate: 125 mL/hr at 11/04/17 0300 .  measles, mumps and rubella vaccine (MMR) injection 0.5 mL, 0.5 mL, Subcutaneous, Once, Gala Romney, Fredderick Phenix, MD .  menthol-cetylpyridinium (CEPACOL) lozenge 3 mg, 1 lozenge, Oral, Q2H PRN, Guss Bunde, MD .  nalbuphine (NUBAIN) injection 5 mg, 5 mg, Intravenous, Q4H PRN **OR** nalbuphine (NUBAIN) injection 5 mg, 5 mg, Subcutaneous, Q4H PRN, Josephine Igo, MD .  nalbuphine (NUBAIN) injection 5 mg, 5 mg, Intravenous, Once PRN **OR** nalbuphine (NUBAIN) injection 5 mg, 5 mg, Subcutaneous, Once PRN, Josephine Igo, MD .  naloxone [facility]) injection 0.4 mg, 0.4 mg, Intravenous, PRN **AND** sodium chloride flush (NS) 0.9 % injection 3 mL, 3 mL, Intravenous, PRN, Josephine Igo, MD .  naloxone HCl [facility]) 2 mg in dextrose 5 % 250 mL infusion, 1-4 mcg/kg/hr, Intravenous, Continuous PRN, Josephine Igo, MD .  ondansetron [facility]) injection 4 mg, 4 mg, Intravenous, Q8H PRN, Josephine Igo, MD, 4 mg at 11/03/17 1504 .  oxyCODONE-acetaminophen (PERCOCET/ROXICET) 5-325 MG per tablet 2 tablet, 2 tablet, Oral, Q4H PRN, Moss, Amber, DO, 1 tablet at 11/04/17 0232 .  prenatal multivitamin tablet 1 tablet, 1 tablet, Oral, Q1200, Guss Bunde, MD .  senna-docusate (Senokot-S) tablet 2 tablet, 2 tablet, Oral, Q24H, Guss Bunde, MD, 2 tablet at 11/03/17 2323 .  simethicone (MYLICON) chewable tablet 80 mg, 80 mg, Oral, Q24H, Guss Bunde, MD, 80 mg at 11/03/17 2323 .  simethicone (MYLICON) chewable tablet 80 mg, 80 mg, Oral, PRN,  Guss Bunde, MD .  zolpidem (AMBIEN) tablet 5 mg, 5 mg, Oral, QHS PRN, Guss Bunde, MD Recent Labs    11/02/17 0950 11/04/17 0529  HGB 13.7 12.6  HCT 39.0 35.6*    Assessment/Plan:  Patient is 42 y.o. G1P1001 POD#1 s/p primary classical c-section at 16w0dfor large cervical fibroid. She is doing very well, recovering appropriately and complains only of incisional pain. Has been up to ambulate, due to void this am as foley recently out. Post op H/H appropriate.  Continue routine post partum care Pain meds prn Regular diet    KSloan Leiter1/30/2019, 7:50 AM

## 2017-11-04 NOTE — Lactation Note (Signed)
This note was copied from a baby's chart. Lactation Consultation Note  Patient Name: Caitlin Cain ZTIWP'Y Date: 11/04/2017 Reason for consult: Follow-up assessment;Difficult latch Baby is now 34 hours old and continues to have a difficult latch.  Baby is currently very fussy at breast.  Mom calmed baby and then baby became sleepy.  Some attempts at latching but unable to sustain a latch more than 2 sucks.  Nipple shield applied but baby went to sleep.  When baby is taken away from breast she starts cueing and becomes fussy.  Mom pumped 5 mls which was cup fed to baby.  Baby still acting hungry so fed 10 mls of formula by cup.  Baby tolerated feeding well and calmed down.  Instructed to continue attempting to latch with cues using nipple shield if helpful, post pump every 3 hours and cup feed 10-15 mls of formula and or breastmilk if available.  Report given to RN.  Maternal Data    Feeding Feeding Type: Breast Fed  LATCH Score Latch: Repeated attempts needed to sustain latch, nipple held in mouth throughout feeding, stimulation needed to elicit sucking reflex.  Audible Swallowing: None  Type of Nipple: Everted at rest and after stimulation  Comfort (Breast/Nipple): Soft / non-tender  Hold (Positioning): Assistance needed to correctly position infant at breast and maintain latch.  LATCH Score: 6  Interventions Interventions: Assisted with latch;Breast compression;Skin to skin;Adjust position;Breast massage;Support pillows  Lactation Tools Discussed/Used Tools: Nipple Shields Nipple shield size: 20   Consult Status      Ave Filter 11/04/2017, 3:13 PM
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
## 2017-11-04 NOTE — Plan of Care (Signed)
Patient progressing well post-partum, able to ambulate in room. Verbalizes pain from fibroids, not severe. Pain mgmt given. Will continue to monitor.

## 2017-11-04 NOTE — Lactation Note (Signed)
This note was copied from a baby's chart. Lactation Consultation Note Baby 71 hrs old. Fussy. Crying at breast will not latch. Tongue thrust, has no suck swallow coordination. W/gloved finger LC assessed suck. Wouldn't suck, clamped, tongue humped in back, can feel tongue and bottom jaw givering at times. Baby gassy, burps, has reflux, needing to spit but wont, has bad taste after burping d/t frowning. Will calm for short period after burps, then cries again. Changed baby's diaper, void noted, baby passed gas has spot of stool in diaper. Discussed w/mom FOB can hold baby while mom rest, then mom hold baby STS if becomes real fussy, and baby needs to be STS every couple hours to encouraged to feed. If mom is awake encouraged to hold baby STS. Mom encouraged to feed baby 8-12 times/24 hours and with feeding cues. Newborn feeding habits, I&O, supply and demand discussed. Mom has wide space "V" shaped breast, everted short shaft. Areola edema to Rt. Breast. Reverse pressure helpful. Encouraged to pre-pump and stimulate prior to latching.  Shells given to wear in bra in am. Hand pump given to pre-pump. Hand expression taught w/thick drops collected in spoon. Volume not measurable.  Mom shown how to use DEBP & how to disassemble, clean, & reassemble parts. Mom knows to pump q3h for 15-20 min. Mom pumping before Dalhart left rm. Noted thick yellow/orange colostrum. Praised mom. Encouraged hand expression after pumping.  Answered a lot of questions parents had. Encouraged to call for assistance if needed.  Mom has long transverse incision, abd distended. Encouraged football hold for feedings. Discussed support, comfort, and props.  [place] brochure given w/resources, support groups and [place] services.  Patient Name: Girl Payal Stanforth SLHTD'S Date: 11/04/2017 Reason for consult: Initial assessment;Maternal endocrine disorder Type of Endocrine Disorder?: Diabetes   Maternal Data Has patient been taught Hand  Expression?: Yes Does the patient have breastfeeding experience prior to this delivery?: No  Feeding Feeding Type: Breast Milk Length of feed: 0 min  LATCH Score Latch: Too sleepy or reluctant, no latch achieved, no sucking elicited.  Audible Swallowing: None  Type of Nipple: Everted at rest and after stimulation  Comfort (Breast/Nipple): Soft / non-tender  Hold (Positioning): Full assist, staff holds infant at breast  LATCH Score: 4  Interventions Interventions: Breast feeding basics reviewed;Reverse pressure;Assisted with latch;Breast compression;Shells;Skin to skin;Adjust position;Breast massage;Support pillows;Hand pump;Hand express;Position options;DEBP;Pre-pump if needed;Expressed milk  Lactation Tools Discussed/Used Tools: Shells;Pump Shell Type: Inverted Breast pump type: Double-Electric Breast Pump;Manual Pump Review: Setup, frequency, and cleaning;Milk Storage Initiated by:: Allayne Stack RN IBCLC Date initiated:: 11/04/17   Consult Status Consult Status: Follow-up Date: 11/04/17 Follow-up type: In-patient    Ismelda Weatherman, Elta Guadeloupe 11/04/2017, 2:24 AM

## 2017-11-05 MED ORDER — POLYETHYLENE GLYCOL 3350 17 G PO PACK
17.0000 g | PACK | Freq: Every day | ORAL | Status: DC
  Administered 2017-11-05 – 2017-11-06 (×2): 17 g via ORAL
  Filled 2017-11-05 (×3): qty 1

## 2017-11-05 MED ORDER — BACITRACIN-NEOMYCIN-POLYMYXIN OINTMENT TUBE
TOPICAL_OINTMENT | CUTANEOUS | Status: DC | PRN
  Administered 2017-11-06 (×2): via TOPICAL
  Filled 2017-11-05: qty 14.17

## 2017-11-05 NOTE — Progress Notes (Signed)
Subjective: Postpartum Day 2: Cesarean Delivery Patient reports incisional pain, tolerating PO and no problems voiding.    Objective: Vital signs in last 24 hours: Temp:  [97.9 F (36.6 C)-98.6 F (37 C)] 97.9 F (36.6 C) (01/30 1727) Pulse Rate:  [59-75] 75 (01/30 1727) Resp:  [20] 20 (01/30 1727) BP: (96-105)/(59-60) 105/59 (01/30 1727) SpO2:  [99 %] 99 % (01/30 1727)  Physical Exam:  General: alert, cooperative and no distress  HR RRR, Lungs clear Lochia: appropriate Uterine Fundus: firm Incision: no significant drainage DVT Evaluation: No evidence of DVT seen on physical exam.  Recent Labs    11/02/17 0950 11/04/17 0529  HGB 13.7 12.6  HCT 39.0 35.6*    Assessment/Plan: Status post Cesarean section. Doing well postoperatively. Wants to stay one more day, sore when moving  RN states patient walked the halls twice last night Wants to shower today.  Told she can remove dressing, pat dry and RN will replace dry dressing Continue current care. Anticipate discharge tomorrow  Hansel Feinstein 11/05/2017, 5:27 AM

## 2017-11-05 NOTE — Lactation Note (Signed)
This note was copied from a baby's chart. Lactation Consultation Note  Patient Name: Caitlin Cain JOINO'M Date: 11/05/2017 Reason for consult: Follow-up assessment Assisted with latching baby to breast using a 20 mm nipple shield.  5 french feeding tube and syringe used at breast.  Baby latched easily and fed 15 minutes on each breast.  Baby took 22 mls of transitional milk and tolerated well.  Maternal Data    Feeding Feeding Type: Breast Milk Length of feed: 30 min  LATCH Score Latch: Grasps breast easily, tongue down, lips flanged, rhythmical sucking.  Audible Swallowing: Spontaneous and intermittent  Type of Nipple: Everted at rest and after stimulation  Comfort (Breast/Nipple): Filling, red/small blisters or bruises, mild/mod discomfort  Hold (Positioning): No assistance needed to correctly position infant at breast.  LATCH Score: 9  Interventions    Lactation Tools Discussed/Used Tools: Nipple Shields Nipple shield size: 20   Consult Status Consult Status: Follow-up Date: 11/06/17 Follow-up type: In-patient    Ave Filter 11/05/2017, 3:59 PM

## 2017-11-05 NOTE — Progress Notes (Signed)
Pt took shower and removed pressure dressing. Pt c/o skin tear/abrasion on right lateral abdomen from dressing removal and is requesting antibiotic ointment. Dr. Casandra Doffing called and verbal telephone order received.

## 2017-11-05 NOTE — Lactation Note (Signed)
This note was copied from a baby's chart. Lactation Consultation Note  Patient Name: Caitlin Cain NBVAP'O Date: 11/05/2017  Mom is currently pumping and obtaining transitional milk.  She states she is latching baby and using curved tip syringe to supplement at breast.  Mom states baby is not getting a deep latch and nipples are sore.  Instructed to call out for assist when baby ready to feed   Maternal Data    Feeding    LATCH Score                   Interventions    Lactation Tools Discussed/Used     Consult Status      Ave Filter 11/05/2017, 3:08 PM

## 2017-11-06 LAB — TYPE AND SCREEN
ABO/RH(D): O POS
ANTIBODY SCREEN: NEGATIVE
UNIT DIVISION: 0
UNIT DIVISION: 0

## 2017-11-06 LAB — BPAM RBC
Blood Product Expiration Date: 201903012359
Blood Product Expiration Date: 201903012359
Unit Type and Rh: 5100
Unit Type and Rh: 5100

## 2017-11-06 MED ORDER — OXYCODONE-ACETAMINOPHEN 5-325 MG PO TABS: 1.0000 | ORAL_TABLET | ORAL | 0 refills | Status: DC | PRN

## 2017-11-06 MED ORDER — IBUPROFEN 600 MG PO TABS: 600.0000 mg | ORAL_TABLET | Freq: Four times a day (QID) | ORAL | 0 refills | Status: DC

## 2017-11-06 MED ORDER — POLYETHYLENE GLYCOL 3350 17 G PO PACK: 17.0000 g | PACK | Freq: Every day | ORAL | 0 refills | Status: DC

## 2017-11-06 NOTE — Lactation Note (Signed)
This note was copied from a baby's chart. Lactation Consultation Note  Patient Name: Caitlin Cain Date: 11/06/2017  Milk in.  Breasts full this AM.  Mom was told not to pump.  Instructed to pump 4 times per day or more if needed for comfort.  Reviewed engorgement prevention and treatment.  Baby is latching well with the 20 mm nipple shield.  Mom has not needed to supplement the last two feedings.  Recommended lactation outpatient appointment and mom agreeable.  Messaged left for clinic to schedule.   Maternal Data    Feeding Feeding Type: Breast Fed  LATCH Score Latch: Grasps breast easily, tongue down, lips flanged, rhythmical sucking.  Audible Swallowing: A few with stimulation  Type of Nipple: Everted at rest and after stimulation  Comfort (Breast/Nipple): Soft / non-tender  Hold (Positioning): No assistance needed to correctly position infant at breast.  LATCH Score: 9  Interventions    Lactation Tools Discussed/Used Tools: Nipple Shields Nipple shield size: 20   Consult Status      Karell Tukes S 11/06/2017, 11:12 AM

## 2017-11-06 NOTE — Discharge Summary (Signed)
OB Discharge Summary     Patient Name: Caitlin Cain DOB: 07/16/1976 MRN: [id number]  Date of admission: 11/03/2017 Delivering MD: Guss Bunde   Date of discharge: 11/06/2017  Admitting diagnosis: Primary CS Intrauterine pregnancy: [redacted]w[redacted]d     Secondary diagnosis:  Active Problems:   Malpresentation before onset of labor   Status post cesarean section  Additional problems: large fibroid tumors of the uterus     Discharge diagnosis: Term Pregnancy Delivered and GDM A1                                                                                                Post partum procedures:none  Complications: None  Hospital course:  Sceduled C/S   42 y.o. yo G1P1001 at [redacted]w[redacted]d was admitted to the hospital 11/03/2017 for scheduled cesarean section with the following indication:Malpresentation.  Membrane Rupture Time/Date: 9:47 AM ,11/03/2017   Patient delivered a Viable infant.11/03/2017  Details of operation can be found in separate operative note.  Pateint had an uncomplicated postpartum course.  She is ambulating, tolerating a regular diet, passing flatus, and urinating well. Patient is discharged home in stable condition on  11/06/17         Physical exam  Vitals:   11/05/17 0528 11/05/17 0900 11/05/17 1847 11/06/17 0900  BP: (!) 104/58  (!) 94/48 115/64  Pulse: 73  65 64  Resp:   16 18  Temp:  98.5 F (36.9 C) 98.4 F (36.9 C) 98.5 F (36.9 C)  TempSrc:  Oral  Oral  SpO2:   99%   Weight:      Height:       General: alert, cooperative and no distress Lochia: appropriate Uterine Fundus: firm Incision: Healing well with no significant drainage DVT Evaluation: No evidence of DVT seen on physical exam. Negative Homan's sign. Labs: Lab Results  Component Value Date   WBC 14.6 (H) 11/04/2017   HGB 12.6 11/04/2017   HCT 35.6 (L) 11/04/2017   MCV 87.3 11/04/2017   PLT 170 11/04/2017   CMP Latest Ref Rng & Units 08/04/2017  Glucose 65 - 99 mg/dL 80  BUN 7 - 25  mg/dL 7  Creatinine 0.50 - 1.10 mg/dL 0.55  Sodium 135 - 146 mmol/L 136  Potassium 3.5 - 5.3 mmol/L 4.0  Chloride 98 - 110 mmol/L 102  CO2 20 - 32 mmol/L 25  Calcium 8.6 - 10.2 mg/dL 8.9  Total Protein 6.1 - 8.1 g/dL 6.5  Total Bilirubin 0.2 - 1.2 mg/dL 0.3  AST 10 - 30 U/L 14  ALT 6 - 29 U/L 18    Discharge instruction: per After Visit Summary and "Baby and Me Booklet".  After visit meds:  Allergies as of 11/06/2017      Reactions   Codeine Swelling, Other (See Comments)      Medication List    STOP taking these medications   acetaminophen 500 MG tablet Commonly known as:  TYLENOL   fluconazole 150 MG tablet Commonly known as:  DIFLUCAN   terconazole 0.4 % vaginal cream Commonly known as:  TERAZOL 7  triamcinolone ointment 0.5 % Commonly known as:  KENALOG   zolpidem 5 MG tablet Commonly known as:  AMBIEN     TAKE these medications   ibuprofen 600 MG tablet Commonly known as:  ADVIL,MOTRIN Take 1 tablet (600 mg total) by mouth every 6 (six) hours.   oxyCODONE-acetaminophen 5-325 MG tablet Commonly known as:  PERCOCET/ROXICET Take 1-2 tablets by mouth every 4 (four) hours as needed for moderate pain or severe pain.   polyethylene glycol packet Commonly known as:  MIRALAX / GLYCOLAX Take 17 g by mouth daily. Start taking on:  11/07/2017   PRENATAL VITAMINS PO Take 1 tablet by mouth daily.         Diet: routine diet  Activity: Advance as tolerated. Pelvic rest for 6 weeks.   Outpatient follow up:4 days for staple removal Follow up Appt:No future appointments. Follow up Visit:No Follow-up on file.  Postpartum contraception: Abstinence  Newborn Data: Live born female  Birth Weight: 6 lb 10.9 oz (3030 g) APGAR: 4, 9  Newborn Delivery   Birth date/time:  11/03/2017 09:48:00 Delivery type:  C-Section, Classical C-section categorization:  Primary     Baby Feeding: Breast Disposition:home with mother   11/06/2017 Silas Sacramento, MD

## 2017-11-06 NOTE — Discharge Instructions (Signed)

## 2017-11-10 ENCOUNTER — Ambulatory Visit (INDEPENDENT_AMBULATORY_CARE_PROVIDER_SITE_OTHER): Payer: 59 | Admitting: Obstetrics & Gynecology

## 2017-11-10 ENCOUNTER — Encounter: Payer: Self-pay | Admitting: Obstetrics & Gynecology

## 2017-11-10 VITALS — Resp 16 | Ht 67.0 in

## 2017-11-10 DIAGNOSIS — Z98891 History of uterine scar from previous surgery: Secondary | ICD-10-CM

## 2017-11-10 MED ORDER — OXYCODONE-ACETAMINOPHEN 5-325 MG PO TABS: 1.0000 | ORAL_TABLET | ORAL | 0 refills | Status: DC | PRN

## 2017-11-10 MED FILL — OXYCODONE-ACETAMINOPHEN 5-3: 5-325 | 3 days supply | Qty: 30 | Fill #0

## 2017-11-11 NOTE — Progress Notes (Signed)
Staples removed and steri strips placed. Uterus still enlarged with fibroids.  Vertical skin and uterus incision; pt needing more percocet and 30 tablets ordered. Mild pedal edema  RTC 5 weeks Pt wants minimally invasive hysterectomy if possible.  Will set up appt with Dr. Rip Harbour for evaluation.

## 2017-11-12 ENCOUNTER — Other Ambulatory Visit: Payer: Self-pay | Admitting: Obstetrics & Gynecology

## 2017-11-13 ENCOUNTER — Other Ambulatory Visit: Payer: 59 | Admitting: *Deleted

## 2017-11-21 ENCOUNTER — Other Ambulatory Visit: Payer: Self-pay | Admitting: Obstetrics & Gynecology

## 2017-11-21 MED ORDER — NITROFURANTOIN MONOHYD MACRO 100 MG PO CAPS: 100.0000 mg | ORAL_CAPSULE | Freq: Two times a day (BID) | ORAL | 0 refills | Status: DC

## 2017-11-21 NOTE — Progress Notes (Signed)
Pt c/o UTI symptoms for >24 hours.  No fevr.  Macrobid 100 mg bid for 5 days.  If high fever, back pain, N/V, pt should come to [facility]U.

## 2017-11-24 ENCOUNTER — Ambulatory Visit (INDEPENDENT_AMBULATORY_CARE_PROVIDER_SITE_OTHER): Payer: 59 | Admitting: Obstetrics & Gynecology

## 2017-11-24 ENCOUNTER — Encounter: Payer: Self-pay | Admitting: Obstetrics & Gynecology

## 2017-11-24 VITALS — BP 118/76 | HR 97 | Temp 96.8°F | Wt 163.0 lb

## 2017-11-24 DIAGNOSIS — N898 Other specified noninflammatory disorders of vagina: Secondary | ICD-10-CM

## 2017-11-24 DIAGNOSIS — Z6379 Other stressful life events affecting family and household: Secondary | ICD-10-CM | POA: Diagnosis not present

## 2017-11-24 DIAGNOSIS — R300 Dysuria: Secondary | ICD-10-CM

## 2017-11-24 DIAGNOSIS — R7309 Other abnormal glucose: Secondary | ICD-10-CM

## 2017-11-24 DIAGNOSIS — R7302 Impaired glucose tolerance (oral): Secondary | ICD-10-CM

## 2017-11-24 LAB — POCT URINALYSIS DIPSTICK
Bilirubin, UA: NEGATIVE
Glucose, UA: NEGATIVE
KETONES UA: NEGATIVE
NITRITE UA: NEGATIVE
PROTEIN UA: NEGATIVE
Spec Grav, UA: 1.01 (ref 1.010–1.025)
Urobilinogen, UA: 0.2 E.U./dL
pH, UA: 7 (ref 5.0–8.0)

## 2017-11-24 MED ORDER — CEPHALEXIN 500 MG PO CAPS: 500.0000 mg | ORAL_CAPSULE | Freq: Four times a day (QID) | ORAL | 2 refills | Status: DC

## 2017-11-24 MED ORDER — LIDOCAINE 5 % EX OINT: 1.0000 "application " | TOPICAL_OINTMENT | CUTANEOUS | 1 refills | Status: DC | PRN

## 2017-11-24 NOTE — Progress Notes (Addendum)
Subjective:    Patient ID: Caitlin Cain, female    DOB: 05-02-1976, 42 y.o.   MRN: [id number]  HPI  Pt presents with several day history of not feeling well.  She is tired, body aches, breast tenderness, chills.  Pt also having shooting vaginal pain and pain at the introitus.  Pt thought she had a UTI.  Macrobid called in.  Pt not feeling better.  Pt also having breast feeding issues and has decided to quit breast feeding.  She is sad about this, however, she is comfortable with her decision after speaking with her pediatrician.  Pt still having emotional stress with FOB.  They have argued and she has asked him to leave th house twice.  She is seeing an Secretary/administrator and couples counselor.  She is looking for the proper time to ask him to leave th house permanently.  She is fearing the encounter.  Next couples therapy is in 2 weeks.    Review of Systems  Constitutional: Positive for chills and fatigue. Negative for fever.  HENT: Negative.   Eyes: Negative.   Respiratory: Negative for cough and shortness of breath.   Cardiovascular: Negative.   Gastrointestinal: Positive for abdominal pain.  Genitourinary: Positive for pelvic pain, vaginal bleeding and vaginal pain. Negative for dysuria, flank pain and frequency.  Neurological: Negative for headaches.  Psychiatric/Behavioral: Positive for agitation.       Pt tearful and stressed       Objective:   Physical Exam  Constitutional: She is oriented to person, place, and time. She appears well-developed and well-nourished. No distress.  HENT:  Head: Normocephalic and atraumatic.  Eyes: Conjunctivae are normal.  Cardiovascular: Normal rate and regular rhythm.  Pulmonary/Chest: Effort normal. No respiratory distress. She has no wheezes.  Abdominal: Soft. Bowel sounds are normal. She exhibits no distension and no mass. There is no tenderness. There is no rebound and no guarding.  Genitourinary:  Genitourinary Comments:  Fourchette is red and tender to q tip--exam c/w provoked vulvodynia Increased pelvic floor tension--this was not present during pregnancy.  Pt had multiple yeast infections so multpile exams were done.  Increase in vaginal tone is new Pt mildly tender of posterior aspet of fibroid that bulges into vagina. No tenderness over bladder or urethra   Musculoskeletal: She exhibits no edema.  Neurological: She is alert and oriented to person, place, and time.  Skin: Skin is warm and dry.  Psychiatric: She has a normal mood and affect.  Vitals reviewed.  Vitals:   11/24/17 1600  BP: 118/76  Pulse: 97  Temp: (!) 96.8 F (36 C)  Weight: 163 lb (73.9 kg)  Pt drank water prior to having oral temp taken.  Breasts are full and tender.  Right breast has redness, increase swelling, and more tenderness.      Assessment & Plan:  42 yo female presents for lethargy, breast pain, vaginal pain  1-Mastitis--Keflex qid; Advised how to quit breast feeding.  Pt should feel better in 24-36 hours of taking antibiotics 2-Test for yeast/BV 3-Counseling around protecting herself and addressing life stress with FOB.  Continue counseling.   4-Vulvar pain--will r/o vaginitis.  Pt has been tense and garding due to pain after surgery.  This increase tension leading to pelvic floor dysfunction.  If still present in 2 eweks, would suggest pelvic PT to help with muscle spasm and pain.  Topical lidocaine prn.    45 minutes spent face to face with the patient with >50%  counseling about social situation, pelvic pain, mastitis, breast feeding and infant development.

## 2017-11-24 NOTE — Patient Instructions (Signed)
Wear 2 sports bras or an ace bandage. No breast movement or nipple stimulation (no hot water on breasts in shower) Cold cabbage leaves on breasts Ice packs in arm pits for pain Tylenol around the clock.  Take antibiotics as prescribed.

## 2017-11-25 LAB — URINE CULTURE
MICRO NUMBER:: 90218959
Result:: NO GROWTH
SPECIMEN QUALITY: ADEQUATE

## 2017-11-25 LAB — WET PREP FOR TRICH, YEAST, CLUE
MICRO NUMBER: 90219036
Specimen Quality: ADEQUATE

## 2017-11-25 MED FILL — LIDOCAINE 5 % OINT: 5 | 30 days supply | Qty: 35 | Fill #0

## 2017-11-26 NOTE — Addendum Note (Signed)
Addended by: Guss Bunde on: 11/26/2017 12:47 PM   Modules accepted: Level of Service

## 2017-12-09 ENCOUNTER — Ambulatory Visit: Payer: 59 | Admitting: Obstetrics & Gynecology

## 2017-12-09 ENCOUNTER — Encounter: Payer: Self-pay | Admitting: Obstetrics & Gynecology

## 2017-12-09 ENCOUNTER — Encounter (HOSPITAL_COMMUNITY): Payer: Self-pay

## 2017-12-09 VITALS — BP 114/71 | HR 69 | Ht 69.0 in | Wt 165.0 lb

## 2017-12-09 DIAGNOSIS — D252 Subserosal leiomyoma of uterus: Secondary | ICD-10-CM

## 2017-12-09 DIAGNOSIS — R102 Pelvic and perineal pain: Secondary | ICD-10-CM

## 2017-12-09 DIAGNOSIS — D251 Intramural leiomyoma of uterus: Secondary | ICD-10-CM

## 2017-12-09 DIAGNOSIS — N920 Excessive and frequent menstruation with regular cycle: Secondary | ICD-10-CM

## 2017-12-09 NOTE — Patient Instructions (Signed)
Hysterectomy Information A hysterectomy is a surgery to remove your uterus. After surgery, you will no longer have periods. Also, you will not be able to get pregnant. Reasons for this surgery  You have bleeding that is not normal and keeps coming back.  You have lasting (chronic) lower belly (pelvic) pain.  You have a lasting infection.  The lining of your uterus grows outside your uterus.  The lining of your uterus grows in the muscle of your uterus.  Your uterus falls down into your vagina.  You have a growth in your uterus that causes problems.  You have cells that could turn into cancer (precancerous cells).  You have cancer of the uterus or cervix. Types There are 3 types of hysterectomies. Depending on the type, the surgery will:  Remove the top part of the uterus only.  Remove the uterus and the cervix.  Remove the uterus, cervix, and tissue that holds the uterus in place in the lower belly.  Ways a hysterectomy can be performed There are 5 ways this surgery can be performed.  A cut (incision) is made in the belly (abdomen). The uterus is taken out through the cut.  A cut is made in the vagina. The uterus is taken out through the cut.  Three or four cuts are made in the belly. A surgical device with a camera is put through one of the cuts. The uterus is cut into small pieces. The uterus is taken out through the cuts or the vagina.  Three or four cuts are made in the belly. A surgical device with a camera is put through one of the cuts. The uterus is taken out through the vagina.  Three or four cuts are made in the belly. A surgical device that is controlled by a computer makes a visual image. The device helps the surgeon control the surgical tools. The uterus is cut into small pieces. The pieces are taken out through the cuts or through the vagina.  What can I expect after the surgery?  You will be given pain medicine.  You will need help at home for 3-5 days  after surgery.  You will need to see your doctor in 2-4 weeks after surgery.  You may get hot flashes, have night sweats, and have trouble sleeping.  You may need to have Pap tests in the future if your surgery was related to cancer. Talk to your doctor. It is still good to have regular exams. This information is not intended to replace advice given to you by your health care provider. Make sure you discuss any questions you have with your health care provider. Document Released: 12/15/2011 Document Revised: 02/28/2016 Document Reviewed: 05/30/2013 Elsevier Interactive Patient Education  2018 [employer] Abdominal Hysterectomy, Care After This sheet gives you information about how to care for yourself after your procedure. Your doctor may also give you more specific instructions. If you have problems or questions, contact your doctor. Follow these instructions at home: Bathing  Do not take baths, swim, or use a hot tub until your doctor says it is okay. Ask your doctor if you can take showers. You may only be allowed to take sponge baths for bathing.  Keep the bandage (dressing) dry until your doctor says it can be taken off. Surgical cut ( incision) care  Follow instructions from your doctor about how to take care of your cut from surgery. Make sure you: ? Wash your hands with soap and water before you change   your bandage (dressing). If you cannot use soap and water, use hand sanitizer. ? Change your bandage as told by your doctor. ? Leave stitches (sutures), skin glue, or skin tape (adhesive) strips in place. They may need to stay in place for 2 weeks or longer. If tape strips get loose and curl up, you may trim the loose edges. Do not remove tape strips completely unless your doctor says it is okay.  Check your surgical cut area every day for signs of infection. Check for: ? Redness, swelling, or pain. ? Fluid or blood. ? Warmth. ? Pus or a bad smell. Activity  Do gentle, daily  exercise as told by your doctor. You may be told to take short walks every day and go farther each time.  Do not lift anything that is heavier than 10 lb (4.5 kg), or the limit that your doctor tells you, until he or she says that it is safe.  Do not drive or use heavy machinery while taking prescription pain medicine.  Do not drive for 24 hours if you were given a medicine to help you relax (sedative).  Follow your doctor's advice about exercise, driving, and general activities. Ask your doctor what activities are safe for you. Lifestyle  Do not douche, use tampons, or have sex for at least 6 weeks or as told by your doctor.  Do not drink alcohol until your doctor says it is okay.  Drink enough fluid to keep your pee (urine) clear or pale yellow.  Try to have someone at home with you for the first 1-2 weeks to help.  Do not use any products that contain nicotine or tobacco, such as cigarettes and e-cigarettes. These can slow down healing. If you need help quitting, ask your doctor. General instructions  Take over-the-counter and prescription medicines only as told by your doctor.  Do not take aspirin or ibuprofen. These medicines can cause bleeding.  To prevent or treat constipation while you are taking prescription pain medicine, your doctor may suggest that you: ? Drink enough fluid to keep your urine clear or pale yellow. ? Take over-the-counter or prescription medicines. ? Eat foods that are high in fiber, such as:  Fresh fruits and vegetables.  Whole grains.  Beans. ? Limit foods that are high in fat and processed sugars, such as fried and sweet foods.  Keep all follow-up visits as told by your doctor. This is important. Contact a doctor if:  You have chills or fever.  You have redness, swelling, or pain around your cut.  You have fluid or blood coming from your cut.  Your cut feels warm to the touch.  You have pus or a bad smell coming from your cut.  Your  cut breaks open.  You feel dizzy or light-headed.  You have pain or bleeding when you pee.  You keep having watery poop (diarrhea).  You keep feeling sick to your stomach (nauseous) or keep throwing up (vomiting).  You have unusual fluid (discharge) coming from your vagina.  You have a rash.  You have a reaction to your medicine.  Your pain medicine does not help. Get help right away if:  You have a fever and your symptoms get worse all of a sudden.  You have very bad belly (abdominal) pain.  You are short of breath.  You pass out (faint).  You have pain, swelling, or redness of your leg.  You bleed a lot from your vagina and notice clumps of blood (  clots). Summary  Do not take baths, swim, or use a hot tub until your doctor says it is okay. Ask your doctor if you can take showers. You may only be allowed to take sponge baths for bathing.  Follow your doctor's advice about exercise, driving, and general activities. Ask your doctor what activities are safe for you.  Do not lift anything that is heavier than 10 lb (4.5 kg), or the limit that your doctor tells you, until he or she says that it is safe.  Try to have someone at home with you for the first 1-2 weeks to help. This information is not intended to replace advice given to you by your health care provider. Make sure you discuss any questions you have with your health care provider. Document Released: 07/01/2008 Document Revised: 09/10/2016 Document Reviewed: 09/10/2016 Elsevier Interactive Patient Education  2017 Elsevier Inc.  

## 2017-12-09 NOTE — Progress Notes (Signed)
Subjective:     Caitlin Cain is a 42 y.o. female here for a routine exam. G1P1 Current complaints: Pt was seeing a GYN in [place] for management of fibroids. During her workup she was dx with a pregnancy.   Her pregnancy was complicated with pain due to the fibroids.   Pt is s/p c-section Jan 29th, 2019.   Pt does not desire fertility. Pt reports 1 year of bad cramps and abd swelling from the fibroids prior to the pregnancy.   PAP WNL 2018  Pt denies no abd pain since delivery but, she has had some incision pain.  She c/o constipation after the delivery since she was on narcotics. Pt was on atbx for mastitis.      Pts mother was dx'd with breast cancer at age 34 yo.   Gynecologic History LMP: May 2018  Contraception: abstinence Last mammogram: 2018 Results were: normal  Obstetric History OB History  Gravida Para Term Preterm AB Living  1 1 1     1   SAB TAB Ectopic Multiple Live Births        0 1    # Outcome Date GA Lbr Len/2nd Weight Sex Delivery Anes PTL Lv  1 Term 11/03/17 [redacted]w[redacted]d  6 lb 10.9 oz (3.03 kg) F CS-Classical Spinal, EPI  LIV     The following portions of the patient's history were reviewed and updated as appropriate: allergies, current medications, past family history, past medical history, past social history, past surgical history and problem list.  Review of Systems Pertinent items are noted in HPI.    Objective:  BP 114/71   Pulse 69   Ht 5\' 9"  (1.753 m)   Wt 165 lb (74.8 kg)   Breastfeeding? No   BMI 24.37 kg/m    CONSTITUTIONAL: Well-developed, well-nourished female in no acute distress.  HENT:  Normocephalic, atraumatic EYES: Conjunctivae and EOM are normal. No scleral icterus.  NECK: Normal range of motion SKIN: Skin is warm and dry. No rash noted. Not diaphoretic.No pallor. Caitlin Cain: Alert and oriented to person, place, and time. Normal coordination.  Abd: well healed vertical incision. Uterine fundus palpable 2 cm below the umbilicus.  Irreg contour of uterus.  GU: EGBUS: no lesions Vagina: no blood in vault Cervix: no lesion; no mucopurulent d/c Uterus: enlarged to 18 weeks sized. Wide base. Sl mobile.  Adnexa: unable to palpate due to enlarged uterus.   08/18/2017 [place]  Cervix Uterus Adnexa  Uterus  Multiple fibroids noted, see table below. ---------------------------------------------------------------------- Myomas   Site                     L(cm)      W(cm)      D(cm)      Location   Cervix                   12.16      9.62       10.37   Fundus                   9.64       6.65       5.69  Assessment:   uterine fibroids- symptomatic. Pt sent to eval for surgery. Pt queried about robotic hyst but, was prev told that she was not a good candidate for this procedure.  Pt would like to proceed with surgery ASAP as she has been out of work and was [employer] and does not  wish to have all of her sx return PP.  I reviewed with pt her option s including [place] and Abd hyst. She wishes to proceed with a TAH. She has a FH of breat cancer and I reviewed a bilateral salpingectomy which she wishes to proceed with.  Pt asked if she could be placed in a cancellation if one comes up prior to April.    Plan:   Patient desires surgical management with TAH with bilateral salpingectomy.  The risks of surgery were discussed in detail with the patient including but not limited to: bleeding which may require transfusion or reoperation; infection which may require prolonged hospitalization or re-hospitalization and antibiotic therapy; injury to bowel, bladder, ureters and major vessels or other surrounding organs; need for additional procedures including laparotomy; thromboembolic phenomenon, incisional problems and other postoperative or anesthesia complications.  Patient was told that the likelihood that her condition and symptoms will be treated effectively with this surgical management was very high; the postoperative expectations were also  discussed in detail. The patient also understands the alternative treatment options which were discussed in full. All questions were answered.  She was told that she will be contacted by our surgical scheduler regarding the time and date of her surgery; routine preoperative instructions of having nothing to eat or drink after midnight on the day prior to surgery and also coming to the hospital 1 1/2 hours prior to her time of surgery were also emphasized.  She was told she may be called for a preoperative appointment about a week prior to surgery and will be given further preoperative instructions at that visit. Printed patient education handouts about the procedure were given to the patient to review at home.  Pt is s/p a c-section va a vertical skin incision 5 weeks prev. I have explaned to her that I would need a min of 6 weeks between cases. She prefers a transverse incision. I have explained to her that this woul leave a 'T' on her abd and she says that she understands and wishes to proceed with this inciison if possible.  Total face-to-face time with patient was 25 min.  Greater than 50% was spent in counseling and coordination of care with the patient.    Oumou Smead L. Harraway-Smith, M.D., Cherlynn June

## 2017-12-10 ENCOUNTER — Encounter: Payer: Self-pay | Admitting: Obstetrics & Gynecology

## 2017-12-10 DIAGNOSIS — N920 Excessive and frequent menstruation with regular cycle: Secondary | ICD-10-CM

## 2017-12-10 NOTE — Patient Instructions (Addendum)
Your procedure is scheduled on: Tuesday December 15, 2017 at 7:30 am  Enter through the Main Entrance of [facility] at: 6:00 am  Pick up the phone at the desk and dial [phone].  Call this number if you have problems the morning of surgery: [phone].  Remember: Do NOT eat food or drink any liquids after: Midnight on Monday March 11  Take these medicines the morning of surgery with a SIP OF WATER: None  STOP ALL HERBAL AND SUPPLEMENT 1 WEEK PRIOR TO SURGERY  DO NOT SMOKE DAY OF SURGERY  Do NOT wear jewelry (body piercing), metal hair clips/bobby pins, make-up, or nail polish. Do NOT wear lotions, powders, or perfumes.  You may wear deoderant. Do NOT shave for 48 hours prior to surgery. Do NOT bring valuables to the hospital. Contacts, dentures, or bridgework may not be worn into surgery. Leave suitcase in car.  After surgery it may be brought to your room. For patients admitted to the hospital, checkout time is 11:00 AM the day of discharge.

## 2017-12-11 ENCOUNTER — Other Ambulatory Visit: Payer: Self-pay

## 2017-12-11 ENCOUNTER — Encounter (HOSPITAL_COMMUNITY)
Admission: RE | Admit: 2017-12-11 | Discharge: 2017-12-11 | Disposition: A | Discharge status: Home / Self Care | Payer: 59 | Source: Ambulatory Visit | Attending: Obstetrics & Gynecology | Admitting: Obstetrics & Gynecology

## 2017-12-11 ENCOUNTER — Encounter (HOSPITAL_COMMUNITY): Payer: Self-pay

## 2017-12-11 DIAGNOSIS — Z01812 Encounter for preprocedural laboratory examination: Secondary | ICD-10-CM | POA: Diagnosis not present

## 2017-12-11 LAB — CBC
HEMATOCRIT: 43.1 % (ref 36.0–46.0)
HEMOGLOBIN: 15.5 g/dL — AB (ref 12.0–15.0)
MCH: 30.5 pg (ref 26.0–34.0)
MCHC: 36 g/dL (ref 30.0–36.0)
MCV: 84.8 fL (ref 78.0–100.0)
Platelets: 205 10*3/uL (ref 150–400)
RBC: 5.08 MIL/uL (ref 3.87–5.11)
RDW: 12 % (ref 11.5–15.5)
WBC: 7.8 10*3/uL (ref 4.0–10.5)

## 2017-12-14 ENCOUNTER — Ambulatory Visit: Payer: 59 | Admitting: Obstetrics & Gynecology

## 2017-12-14 ENCOUNTER — Telehealth: Payer: Self-pay | Admitting: Obstetrics & Gynecology

## 2017-12-14 MED ORDER — CLONIDINE HCL 0.2 MG PO TABS
0.2000 mg | ORAL_TABLET | Freq: Once | ORAL | Status: AC
  Administered 2017-12-15: 0.2 mg via ORAL
  Filled 2017-12-14: qty 1

## 2017-12-14 NOTE — Telephone Encounter (Signed)
Pt canceled 12/17/17 post partum appt with Gala Romney stating she is having hysterectomy. Pt will check with provider to see if she needs post partum fu as she will be having post op fu appts.

## 2017-12-14 NOTE — Anesthesia Preprocedure Evaluation (Addendum)
Anesthesia Evaluation  Patient identified by MRN, date of birth, ID band Patient awake    Reviewed: Allergy & Precautions, NPO status , Patient's Chart, lab work & pertinent test results  Airway Mallampati: I  TM Distance: >3 FB Neck ROM: Full    Dental no notable dental hx.    Pulmonary neg pulmonary ROS,    Pulmonary exam normal breath sounds clear to auscultation       Cardiovascular negative cardio ROS Normal cardiovascular exam Rhythm:Regular Rate:Normal     Neuro/Psych Anxiety negative neurological ROS     GI/Hepatic negative GI ROS,   Endo/Other    Renal/GU      Musculoskeletal   Abdominal   Peds  Hematology   Anesthesia Other Findings   Reproductive/Obstetrics                            Lab Results  Component Value Date   WBC 7.8 12/11/2017   HGB 15.5 (H) 12/11/2017   HCT 43.1 12/11/2017   MCV 84.8 12/11/2017   PLT 205 12/11/2017   Lab Results  Component Value Date   CREATININE 0.55 08/04/2017   BUN 7 08/04/2017   NA 136 08/04/2017   K 4.0 08/04/2017   CL 102 08/04/2017   CO2 25 08/04/2017     Anesthesia Physical Anesthesia Plan  ASA: II  Anesthesia Plan: General   Post-op Pain Management:    Induction: Intravenous  PONV Risk Score and Plan: 3 and Treatment may vary due to age or medical condition, Dexamethasone, Ondansetron, Scopolamine patch - Pre-op and Midazolam  Airway Management Planned: Oral ETT  Additional Equipment:   Intra-op Plan:   Post-operative Plan:   Informed Consent: I have reviewed the patients History and Physical, chart, labs and discussed the procedure including the risks, benefits and alternatives for the proposed anesthesia with the patient or authorized representative who has indicated his/her understanding and acceptance.   Dental advisory given  Plan Discussed with: CRNA  Anesthesia Plan Comments:         Anesthesia  Quick Evaluation

## 2017-12-15 ENCOUNTER — Inpatient Hospital Stay (HOSPITAL_COMMUNITY): Payer: 59 | Admitting: Anesthesiology

## 2017-12-15 ENCOUNTER — Inpatient Hospital Stay (HOSPITAL_COMMUNITY)
Admission: RE | Admit: 2017-12-15 | Discharge: 2017-12-17 | DRG: 743 | Disposition: A | Discharge status: Home / Self Care | Payer: 59 | Source: Ambulatory Visit | Attending: Obstetrics & Gynecology | Admitting: Obstetrics & Gynecology

## 2017-12-15 ENCOUNTER — Other Ambulatory Visit: Payer: Self-pay

## 2017-12-15 ENCOUNTER — Encounter (HOSPITAL_COMMUNITY): Payer: Self-pay | Admitting: Emergency Medicine

## 2017-12-15 ENCOUNTER — Encounter (HOSPITAL_COMMUNITY)
Admission: RE | Disposition: A | Discharge status: Home / Self Care | Payer: Self-pay | Source: Ambulatory Visit | Attending: Obstetrics & Gynecology

## 2017-12-15 DIAGNOSIS — D219 Benign neoplasm of connective and other soft tissue, unspecified: Secondary | ICD-10-CM | POA: Diagnosis not present

## 2017-12-15 DIAGNOSIS — D259 Leiomyoma of uterus, unspecified: Secondary | ICD-10-CM | POA: Diagnosis present

## 2017-12-15 DIAGNOSIS — N920 Excessive and frequent menstruation with regular cycle: Secondary | ICD-10-CM | POA: Diagnosis present

## 2017-12-15 DIAGNOSIS — Z85828 Personal history of other malignant neoplasm of skin: Secondary | ICD-10-CM | POA: Diagnosis not present

## 2017-12-15 DIAGNOSIS — Z803 Family history of malignant neoplasm of breast: Secondary | ICD-10-CM

## 2017-12-15 HISTORY — PX: HYSTERECTOMY ABDOMINAL WITH SALPINGECTOMY: SHX6725

## 2017-12-15 LAB — TYPE AND SCREEN
ABO/RH(D): O POS
ANTIBODY SCREEN: NEGATIVE

## 2017-12-15 LAB — CBC
HCT: 38 % (ref 36.0–46.0)
HEMOGLOBIN: 13.2 g/dL (ref 12.0–15.0)
MCH: 29.9 pg (ref 26.0–34.0)
MCHC: 34.7 g/dL (ref 30.0–36.0)
MCV: 86.2 fL (ref 78.0–100.0)
Platelets: 156 10*3/uL (ref 150–400)
RBC: 4.41 MIL/uL (ref 3.87–5.11)
RDW: 12.1 % (ref 11.5–15.5)
WBC: 11.8 10*3/uL — ABNORMAL HIGH (ref 4.0–10.5)

## 2017-12-15 LAB — PREGNANCY, URINE: Preg Test, Ur: NEGATIVE

## 2017-12-15 LAB — CREATININE, SERUM
CREATININE: 0.85 mg/dL (ref 0.44–1.00)
GFR calc Af Amer: >60 mL/min (ref >60)
GFR calc non Af Amer: >60 mL/min (ref >60)

## 2017-12-15 SURGERY — HYSTERECTOMY, TOTAL, ABDOMINAL, WITH SALPINGECTOMY
Anesthesia: General | Site: Abdomen | Laterality: Bilateral

## 2017-12-15 MED ORDER — SIMETHICONE 80 MG PO CHEW
80.0000 mg | CHEWABLE_TABLET | Freq: Four times a day (QID) | ORAL | Status: DC | PRN
  Administered 2017-12-16: 80 mg via ORAL
  Filled 2017-12-15: qty 1

## 2017-12-15 MED ORDER — HYDROMORPHONE HCL 1 MG/ML IJ SOLN
INTRAMUSCULAR | Status: AC
  Filled 2017-12-15: qty 1

## 2017-12-15 MED ORDER — BUPIVACAINE HCL (PF) 0.5 % IJ SOLN
INTRAMUSCULAR | Status: DC | PRN
  Administered 2017-12-15: 30 mL

## 2017-12-15 MED ORDER — HYDROMORPHONE 1 MG/ML IV SOLN
INTRAVENOUS | Status: DC
  Administered 2017-12-15: 0.6 mg via INTRAVENOUS
  Administered 2017-12-15: 11:00:00 via INTRAVENOUS
  Administered 2017-12-16: 0.2 mg via INTRAVENOUS
  Administered 2017-12-16: 0.2 mL via INTRAVENOUS
  Administered 2017-12-16: 0.8 mg via INTRAVENOUS
  Filled 2017-12-15 (×2): qty 25

## 2017-12-15 MED ORDER — FENTANYL CITRATE (PF) 100 MCG/2ML IJ SOLN
INTRAMUSCULAR | Status: DC | PRN
  Administered 2017-12-15: 50 ug via INTRAVENOUS
  Administered 2017-12-15: 100 ug via INTRAVENOUS
  Administered 2017-12-15 (×2): 50 ug via INTRAVENOUS

## 2017-12-15 MED ORDER — IBUPROFEN 600 MG PO TABS
600.0000 mg | ORAL_TABLET | Freq: Four times a day (QID) | ORAL | Status: DC | PRN
  Administered 2017-12-16 – 2017-12-17 (×4): 600 mg via ORAL
  Filled 2017-12-15 (×4): qty 1

## 2017-12-15 MED ORDER — POLYETHYLENE GLYCOL 3350 17 G PO PACK
17.0000 g | PACK | Freq: Every day | ORAL | Status: DC | PRN
  Administered 2017-12-15: 17 g via ORAL
  Filled 2017-12-15: qty 1

## 2017-12-15 MED ORDER — SODIUM CHLORIDE 0.9 % IJ SOLN
INTRAMUSCULAR | Status: AC
  Filled 2017-12-15: qty 20

## 2017-12-15 MED ORDER — ENOXAPARIN SODIUM 40 MG/0.4ML ~~LOC~~ SOLN
40.0000 mg | SUBCUTANEOUS | Status: DC
  Administered 2017-12-16 – 2017-12-17 (×2): 40 mg via SUBCUTANEOUS
  Filled 2017-12-15 (×2): qty 0.4

## 2017-12-15 MED ORDER — ROCURONIUM BROMIDE 100 MG/10ML IV SOLN
INTRAVENOUS | Status: AC
  Filled 2017-12-15: qty 1

## 2017-12-15 MED ORDER — SODIUM CHLORIDE 0.9 % IR SOLN
Status: DC | PRN
  Administered 2017-12-15: 3000 mL

## 2017-12-15 MED ORDER — DIPHENHYDRAMINE HCL 50 MG/ML IJ SOLN: 12.5000 mg | Freq: Four times a day (QID) | INTRAMUSCULAR | Status: DC | PRN

## 2017-12-15 MED ORDER — HYDROMORPHONE HCL 1 MG/ML IJ SOLN
0.2500 mg | INTRAMUSCULAR | Status: DC | PRN
  Administered 2017-12-15: 0.5 mg via INTRAVENOUS

## 2017-12-15 MED ORDER — ACETAMINOPHEN 500 MG PO TABS
1000.0000 mg | ORAL_TABLET | Freq: Once | ORAL | Status: AC
  Administered 2017-12-15: 1000 mg via ORAL

## 2017-12-15 MED ORDER — SUGAMMADEX SODIUM 200 MG/2ML IV SOLN
INTRAVENOUS | Status: DC | PRN
  Administered 2017-12-15: 150 mg via INTRAVENOUS

## 2017-12-15 MED ORDER — ACETAMINOPHEN 10 MG/ML IV SOLN: 1000.0000 mg | Freq: Once | INTRAVENOUS | Status: DC | PRN

## 2017-12-15 MED ORDER — NALOXONE HCL 0.4 MG/ML IJ SOLN: 0.4000 mg | INTRAMUSCULAR | Status: DC | PRN

## 2017-12-15 MED ORDER — MIDAZOLAM HCL 2 MG/2ML IJ SOLN
INTRAMUSCULAR | Status: AC
  Filled 2017-12-15: qty 2

## 2017-12-15 MED ORDER — PROPOFOL 10 MG/ML IV BOLUS
INTRAVENOUS | Status: AC
  Filled 2017-12-15: qty 20

## 2017-12-15 MED ORDER — GABAPENTIN 300 MG PO CAPS
ORAL_CAPSULE | ORAL | Status: AC
  Administered 2017-12-15: 300 mg via ORAL
  Filled 2017-12-15: qty 1

## 2017-12-15 MED ORDER — HYDROMORPHONE HCL 1 MG/ML IJ SOLN
INTRAMUSCULAR | Status: AC
  Filled 2017-12-15: qty 0.5

## 2017-12-15 MED ORDER — DOCUSATE SODIUM 100 MG PO CAPS
100.0000 mg | ORAL_CAPSULE | Freq: Two times a day (BID) | ORAL | Status: DC
  Administered 2017-12-15 – 2017-12-17 (×4): 100 mg via ORAL
  Filled 2017-12-15 (×4): qty 1

## 2017-12-15 MED ORDER — BUPIVACAINE LIPOSOME 1.3 % IJ SUSP
20.0000 mL | Freq: Once | INTRAMUSCULAR | Status: DC
  Filled 2017-12-15: qty 20

## 2017-12-15 MED ORDER — ONDANSETRON HCL 4 MG PO TABS
4.0000 mg | ORAL_TABLET | Freq: Four times a day (QID) | ORAL | Status: DC | PRN
  Administered 2017-12-16 – 2017-12-17 (×2): 4 mg via ORAL
  Filled 2017-12-15 (×3): qty 1

## 2017-12-15 MED ORDER — HYDROCODONE-ACETAMINOPHEN 7.5-325 MG PO TABS: 1.0000 | ORAL_TABLET | Freq: Once | ORAL | Status: DC | PRN

## 2017-12-15 MED ORDER — ACETAMINOPHEN 500 MG PO TABS
ORAL_TABLET | ORAL | Status: AC
  Administered 2017-12-15: 1000 mg via ORAL
  Filled 2017-12-15: qty 2

## 2017-12-15 MED ORDER — PROMETHAZINE HCL 25 MG/ML IJ SOLN
6.2500 mg | INTRAMUSCULAR | Status: DC | PRN
  Administered 2017-12-15: 6.25 mg via INTRAVENOUS

## 2017-12-15 MED ORDER — PROPOFOL 10 MG/ML IV BOLUS
INTRAVENOUS | Status: DC | PRN
  Administered 2017-12-15: 180 mg via INTRAVENOUS

## 2017-12-15 MED ORDER — SODIUM CHLORIDE 0.9% FLUSH: 9.0000 mL | INTRAVENOUS | Status: DC | PRN

## 2017-12-15 MED ORDER — SCOPOLAMINE 1 MG/3DAYS TD PT72
MEDICATED_PATCH | TRANSDERMAL | Status: AC
  Administered 2017-12-15: 1.5 mg via TRANSDERMAL
  Filled 2017-12-15: qty 1

## 2017-12-15 MED ORDER — ONDANSETRON HCL 4 MG/2ML IJ SOLN
INTRAMUSCULAR | Status: AC
  Filled 2017-12-15: qty 2

## 2017-12-15 MED ORDER — KETOROLAC TROMETHAMINE 30 MG/ML IJ SOLN
30.0000 mg | Freq: Four times a day (QID) | INTRAMUSCULAR | Status: AC
  Administered 2017-12-15 – 2017-12-16 (×3): 30 mg via INTRAVENOUS
  Filled 2017-12-15 (×2): qty 1

## 2017-12-15 MED ORDER — MEPERIDINE HCL 25 MG/ML IJ SOLN
INTRAMUSCULAR | Status: AC
  Administered 2017-12-15: 12.5 mg via INTRAVENOUS
  Filled 2017-12-15: qty 1

## 2017-12-15 MED ORDER — DEXTROSE-NACL 5-0.45 % IV SOLN
INTRAVENOUS | Status: AC
  Administered 2017-12-15 – 2017-12-16 (×3): via INTRAVENOUS

## 2017-12-15 MED ORDER — LACTATED RINGERS IV SOLN
INTRAVENOUS | Status: DC
  Administered 2017-12-15 (×2): via INTRAVENOUS

## 2017-12-15 MED ORDER — BUPIVACAINE HCL (PF) 0.5 % IJ SOLN
INTRAMUSCULAR | Status: AC
  Filled 2017-12-15: qty 30

## 2017-12-15 MED ORDER — BISACODYL 10 MG RE SUPP: 10.0000 mg | Freq: Every day | RECTAL | Status: DC | PRN

## 2017-12-15 MED ORDER — MENTHOL 3 MG MT LOZG
1.0000 | LOZENGE | OROMUCOSAL | Status: DC | PRN
  Administered 2017-12-17: 3 mg via ORAL
  Filled 2017-12-15: qty 9

## 2017-12-15 MED ORDER — SUGAMMADEX SODIUM 200 MG/2ML IV SOLN
INTRAVENOUS | Status: AC
  Filled 2017-12-15: qty 2

## 2017-12-15 MED ORDER — FLEET ENEMA 7-19 GM/118ML RE ENEM: 1.0000 | ENEMA | Freq: Once | RECTAL | Status: DC | PRN

## 2017-12-15 MED ORDER — DIPHENHYDRAMINE HCL 12.5 MG/5ML PO ELIX
12.5000 mg | ORAL_SOLUTION | Freq: Four times a day (QID) | ORAL | Status: DC | PRN
  Filled 2017-12-15: qty 5

## 2017-12-15 MED ORDER — HYDROMORPHONE HCL 1 MG/ML IJ SOLN
INTRAMUSCULAR | Status: DC | PRN
  Administered 2017-12-15: 1 mg via INTRAVENOUS

## 2017-12-15 MED ORDER — ROCURONIUM BROMIDE 100 MG/10ML IV SOLN
INTRAVENOUS | Status: DC | PRN
  Administered 2017-12-15: 5 mg via INTRAVENOUS
  Administered 2017-12-15 (×2): 10 mg via INTRAVENOUS
  Administered 2017-12-15: 40 mg via INTRAVENOUS
  Administered 2017-12-15: 10 mg via INTRAVENOUS

## 2017-12-15 MED ORDER — DEXAMETHASONE SODIUM PHOSPHATE 4 MG/ML IJ SOLN
INTRAMUSCULAR | Status: DC | PRN
  Administered 2017-12-15: 8 mg via INTRAVENOUS

## 2017-12-15 MED ORDER — EPHEDRINE SULFATE 50 MG/ML IJ SOLN
INTRAMUSCULAR | Status: DC | PRN
  Administered 2017-12-15: 15 mg via INTRAVENOUS

## 2017-12-15 MED ORDER — MEPERIDINE HCL 25 MG/ML IJ SOLN
6.2500 mg | INTRAMUSCULAR | Status: DC | PRN
  Administered 2017-12-15: 12.5 mg via INTRAVENOUS

## 2017-12-15 MED ORDER — PANTOPRAZOLE SODIUM 40 MG PO TBEC
40.0000 mg | DELAYED_RELEASE_TABLET | Freq: Every day | ORAL | Status: DC
  Administered 2017-12-16 – 2017-12-17 (×2): 40 mg via ORAL
  Filled 2017-12-15 (×2): qty 1

## 2017-12-15 MED ORDER — ONDANSETRON HCL 4 MG/2ML IJ SOLN
INTRAMUSCULAR | Status: DC | PRN
  Administered 2017-12-15: 4 mg via INTRAVENOUS

## 2017-12-15 MED ORDER — KETOROLAC TROMETHAMINE 30 MG/ML IJ SOLN
30.0000 mg | Freq: Four times a day (QID) | INTRAMUSCULAR | Status: AC
  Filled 2017-12-15: qty 1

## 2017-12-15 MED ORDER — LIDOCAINE HCL (CARDIAC) 20 MG/ML IV SOLN
INTRAVENOUS | Status: DC | PRN
  Administered 2017-12-15: 100 mg via INTRAVENOUS

## 2017-12-15 MED ORDER — KETOROLAC TROMETHAMINE 30 MG/ML IJ SOLN
INTRAMUSCULAR | Status: AC
  Filled 2017-12-15: qty 1

## 2017-12-15 MED ORDER — KETOROLAC TROMETHAMINE 30 MG/ML IJ SOLN
INTRAMUSCULAR | Status: DC | PRN
  Administered 2017-12-15: 30 mg via INTRAVENOUS

## 2017-12-15 MED ORDER — ONDANSETRON HCL 4 MG/2ML IJ SOLN: 4.0000 mg | Freq: Four times a day (QID) | INTRAMUSCULAR | Status: DC | PRN

## 2017-12-15 MED ORDER — LACTATED RINGERS IV SOLN
INTRAVENOUS | Status: DC
  Administered 2017-12-15: 07:00:00 via INTRAVENOUS

## 2017-12-15 MED ORDER — FENTANYL CITRATE (PF) 250 MCG/5ML IJ SOLN
INTRAMUSCULAR | Status: AC
  Filled 2017-12-15: qty 5

## 2017-12-15 MED ORDER — SCOPOLAMINE 1 MG/3DAYS TD PT72
1.0000 | MEDICATED_PATCH | Freq: Once | TRANSDERMAL | Status: DC
  Administered 2017-12-15: 1.5 mg via TRANSDERMAL

## 2017-12-15 MED ORDER — PROMETHAZINE HCL 25 MG/ML IJ SOLN
INTRAMUSCULAR | Status: AC
  Administered 2017-12-15: 6.25 mg via INTRAVENOUS
  Filled 2017-12-15: qty 1

## 2017-12-15 MED ORDER — DEXAMETHASONE SODIUM PHOSPHATE 10 MG/ML IJ SOLN
INTRAMUSCULAR | Status: AC
  Filled 2017-12-15: qty 1

## 2017-12-15 MED ORDER — LIDOCAINE HCL (CARDIAC) 20 MG/ML IV SOLN
INTRAVENOUS | Status: AC
  Filled 2017-12-15: qty 5

## 2017-12-15 MED ORDER — MIDAZOLAM HCL 2 MG/2ML IJ SOLN
INTRAMUSCULAR | Status: DC | PRN
  Administered 2017-12-15: 1 mg via INTRAVENOUS

## 2017-12-15 MED ORDER — GABAPENTIN 300 MG PO CAPS
300.0000 mg | ORAL_CAPSULE | Freq: Once | ORAL | Status: AC
  Administered 2017-12-15: 300 mg via ORAL

## 2017-12-15 MED ORDER — OXYCODONE-ACETAMINOPHEN 5-325 MG PO TABS
1.0000 | ORAL_TABLET | ORAL | Status: DC | PRN
  Administered 2017-12-16: 1 via ORAL
  Filled 2017-12-15 (×3): qty 1

## 2017-12-15 MED ORDER — CEFAZOLIN SODIUM-DEXTROSE 2-4 GM/100ML-% IV SOLN
2.0000 g | INTRAVENOUS | Status: AC
  Administered 2017-12-15: 2 g via INTRAVENOUS

## 2017-12-15 SURGICAL SUPPLY — 46 items
BARRIER ADHS 3X4 INTERCEED (GAUZE/BANDAGES/DRESSINGS) IMPLANT
BENZOIN TINCTURE PRP APPL 2/3 (GAUZE/BANDAGES/DRESSINGS) IMPLANT
BINDER ABD UNIV 12 45-62 (WOUND CARE) IMPLANT
BINDER ABDOMINAL 46IN 62IN (WOUND CARE)
CANISTER SUCT 3000ML PPV (MISCELLANEOUS) ×3 IMPLANT
CELLS DAT CNTRL 66122 CELL SVR (MISCELLANEOUS) IMPLANT
CLOSURE WOUND 1/2 X4 (GAUZE/BANDAGES/DRESSINGS)
CONT PATH 16OZ SNAP LID 3702 (MISCELLANEOUS) ×3 IMPLANT
DECANTER SPIKE VIAL GLASS SM (MISCELLANEOUS) IMPLANT
DRAPE WARM FLUID 44X44 (DRAPE) ×3 IMPLANT
DRSG OPSITE POSTOP 4X10 (GAUZE/BANDAGES/DRESSINGS) ×3 IMPLANT
DURAPREP 26ML APPLICATOR (WOUND CARE) ×3 IMPLANT
GAUZE SPONGE 4X4 12PLY STRL (GAUZE/BANDAGES/DRESSINGS) ×3 IMPLANT
GAUZE SPONGE 4X4 16PLY XRAY LF (GAUZE/BANDAGES/DRESSINGS) ×3 IMPLANT
GLOVE BIO SURGEON STRL SZ7 (GLOVE) ×3 IMPLANT
GLOVE BIOGEL PI IND STRL 7.0 (GLOVE) ×4 IMPLANT
GLOVE BIOGEL PI INDICATOR 7.0 (GLOVE) ×8
GOWN STRL REUS W/TWL LRG LVL3 (GOWN DISPOSABLE) ×6 IMPLANT
GOWN STRL REUS W/TWL XL LVL3 (GOWN DISPOSABLE) ×3 IMPLANT
NEEDLE HYPO 22GX1.5 SAFETY (NEEDLE) ×3 IMPLANT
NS IRRIG 1000ML POUR BTL (IV SOLUTION) ×3 IMPLANT
PACK ABDOMINAL GYN (CUSTOM PROCEDURE TRAY) ×3 IMPLANT
PAD ABD 7.5X8 STRL (GAUZE/BANDAGES/DRESSINGS) ×6 IMPLANT
PAD OB MATERNITY 4.3X12.25 (PERSONAL CARE ITEMS) ×3 IMPLANT
PENCIL SMOKE EVAC W/HOLSTER (ELECTROSURGICAL) ×3 IMPLANT
PROTECTOR NERVE ULNAR (MISCELLANEOUS) ×6 IMPLANT
RETRACTOR WND ALEXIS 25 LRG (MISCELLANEOUS) IMPLANT
RTRCTR WOUND ALEXIS 18CM MED (MISCELLANEOUS)
RTRCTR WOUND ALEXIS 25CM LRG (MISCELLANEOUS)
SPONGE LAP 18X18 X RAY DECT (DISPOSABLE) ×15 IMPLANT
STAPLER VISISTAT 35W (STAPLE) ×3 IMPLANT
STRIP CLOSURE SKIN 1/2X4 (GAUZE/BANDAGES/DRESSINGS) IMPLANT
SUT VIC AB 0 CT1 18XCR BRD8 (SUTURE) ×3 IMPLANT
SUT VIC AB 0 CT1 27 (SUTURE) ×4
SUT VIC AB 0 CT1 27XBRD ANBCTR (SUTURE) ×2 IMPLANT
SUT VIC AB 0 CT1 36 (SUTURE) ×3 IMPLANT
SUT VIC AB 0 CT1 8-18 (SUTURE) ×6
SUT VIC AB 3-0 CT1 27 (SUTURE) ×2
SUT VIC AB 3-0 CT1 TAPERPNT 27 (SUTURE) ×1 IMPLANT
SUT VIC AB 3-0 SH 27 (SUTURE)
SUT VIC AB 3-0 SH 27X BRD (SUTURE) IMPLANT
SUT VIC AB 4-0 KS 27 (SUTURE) IMPLANT
SUT VICRYL 0 TIES 12 18 (SUTURE) ×3 IMPLANT
SYR CONTROL 10ML LL (SYRINGE) ×3 IMPLANT
TOWEL OR 17X24 6PK STRL BLUE (TOWEL DISPOSABLE) ×6 IMPLANT
TRAY FOLEY CATH SILVER 14FR (SET/KITS/TRAYS/PACK) ×3 IMPLANT

## 2017-12-15 NOTE — Op Note (Signed)
12/15/2017  9:10 AM  PATIENT:  Caitlin Cain  42 y.o. female  PRE-OPERATIVE DIAGNOSIS:  Fibroids  POST-OPERATIVE DIAGNOSIS:  Fibroids  PROCEDURE:  Procedure(s): Total HYSTERECTOMY ABDOMINAL WITH SALPINGECTOMY (Bilateral)  SURGEON:  Surgeon(s) and Role:    * Lavonia Drafts, MD - Primary    * Guss Bunde, MD - Assisting  ANESTHESIA:   general  EBL:  500 mL   BLOOD ADMINISTERED:none  DRAINS: none   LOCAL MEDICATIONS USED:  MARCAINE     SPECIMEN:  Source of Specimen:  Uterus, fallopian tubes and cervix   DISPOSITION OF SPECIMEN:  PATHOLOGY  COUNTS:  YES  TOURNIQUET:  * No tourniquets in log *  DICTATION: .Note written in EPIC  PLAN OF CARE: Admit to inpatient   PATIENT DISPOSITION:  PACU - hemodynamically stable.   Delay start of Pharmacological VTE agent (>24hrs) due to surgical blood loss or risk of bleeding: no  Complications: none immediate  INDICATIONS: The patient is a 42 yo G1P1 with the aforementioned diagnoses who desires definitive surgical management. On the preoperative visit, the risks, benefits, indications, and alternatives of the procedure were reviewed with the patient. On the day of surgery, the risks of surgery were again discussed with the patient including but not limited to: bleeding which may require transfusion or reoperation; infection which may require antibiotics; injury to bowel, bladder, ureters or other surrounding organs; need for additional procedures; thromboembolic phenomenon, incisional problems and other postoperative/anesthesia complications. Written informed consent was obtained.   OPERATIVE FINDINGS: A 20+ week sized fibroids uterus with a large pedunculated fibroid at the fundus. Normal tubes and ovaries bilaterally.   SPECIMENS: Uterus,cervix, bilateral fallopian tubes sent to pathology    DESCRIPTION OF PROCEDURE: The patient received intravenous antibiotics and had sequential compression devices applied to  her lower extremities while in the preoperative area. She was taken to the operating room and placed under general anesthesia without difficulty. The abdomen and perineum were prepped and draped in a sterile manner, and she was placed in a dorsal supine position. A Foley catheter was inserted into the bladder and attached to constant drainage. After an adequate timeout was performed, a transverse skin incision was made. This incision was taken down to the fascia using a scalpel and cautery with care given to maintain good hemostasis. The fascia was grasped with kocher clamps, tented up and the rectus muscles were dissected off using sharp and blunt dissection on the superior and inferior aspects of the fascial incision. The peritoneum entered sharply without complication. This peritoneal cavity was entered digitally. Attention was then turned to the pelvis. The uterus at this point was noted to be mobilized and was delivered up out of the abdomen. The bowel was packed away with moist laparotomy sponges.  At this point the fallopian tubes were grasped with a Babcock clamp.  Using a Ligasure device the mesosalpinx was clamped cut and ligated, excellent hemostasis was noted. The round ligaments on each side were clamped and ligated using the Ligasure and transected with electrocautery allowing entry into the broad ligament. Of note, all clamping and ligation was performed using the Ligasure unless otherwise noted. The anterior and posterior leaves of the broad ligament were separated, and the ureters were inspected to be safely away from the area of dissection bilaterally. The uteroovarian ligaments were bliaterally clamped and doubly  ligated. The uterine arteries were skeletinized bilaterally A bladder flap was then created. The bladder was then bluntly dissected off the lower uterine segment and  cervix with good hemostasis noted. The uterine arteries were then skeletonized bilaterally and then clamped, cut, and  doubly ligated with care given to prevent ureteral injury. After the uterine arteries were clamped, the uterus was removed with a scalpel and the cervical stump was grasped with a Jacob's tenaculum. The uterosacral ligaments were then clamped, cut, and ligated bilaterally. Finally, the cardinal ligaments were clamped, cut, and ligated bilaterally. The uterus was removed.  The cuff angles were closed with multiple figure-of-eight sutures.  The pelvis was irrigated and hemostasis was reconfirmed at all pedicles and along the pelvic sidewall. The ureters were inspected and noted to be peristalsing bilaterally. A cystoscopy was performed using normal saline as the distending medium.  Indigo carmine was given by anesthesia via IV and both ureters were found to be patent. All laparotomy sponges and instruments were removed from the abdomen. Surgicel was placed over the vaginal cuff and the pedicles after the pelvis was irrigated.  The peritoneum was reapproximated with 0 vicryl.  The fascia was closed with 0 vicryl . The subcutaneous fascia was closed with 3-0 vicryl and the skin was a subcuticular suture of 3-0 vicryl. The incision was injected with 30cc of 0.25% Marcaine. Benzoin and steristrips were applied.  Sponge, lap and needle counts were correct times two. The patient was taken to the recovery area awake, extubated and in stable condition.   Amillia Biffle L. Harraway-Smith, M.D., [facility]

## 2017-12-15 NOTE — Anesthesia Postprocedure Evaluation (Signed)
Anesthesia Post Note  Patient: Caitlin Cain  Procedure(s) Performed: HYSTERECTOMY ABDOMINAL WITH SALPINGECTOMY (Bilateral Abdomen)     Patient location during evaluation: PACU Anesthesia Type: General Level of consciousness: awake and alert Pain management: pain level controlled Vital Signs Assessment: post-procedure vital signs reviewed and stable Respiratory status: spontaneous breathing, nonlabored ventilation, respiratory function stable and patient connected to nasal cannula oxygen Cardiovascular status: blood pressure returned to baseline and stable Postop Assessment: no apparent nausea or vomiting Anesthetic complications: no    Last Vitals:  Vitals:   12/15/17 1200 12/15/17 1300  BP: (!) 99/55 (!) 91/45  Pulse: 64 69  Resp: 16 10  Temp: 36.7 C 36.8 C  SpO2: 99% 100%    Last Pain:  Vitals:   12/15/17 1300  TempSrc: Oral  PainSc:    Pain Goal: Patients Stated Pain Goal: 4 (12/15/17 1151)               Barnet Glasgow
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
## 2017-12-15 NOTE — Brief Op Note (Signed)
12/15/2017  9:10 AM  PATIENT:  [name]  42 y.o. female  PRE-OPERATIVE DIAGNOSIS:  Fibroids  POST-OPERATIVE DIAGNOSIS:  Fibroids  PROCEDURE:  Procedure(s): HYSTERECTOMY ABDOMINAL WITH SALPINGECTOMY (Bilateral)  SURGEON:  Surgeon(s) and Role:    * Lavonia Drafts, MD - Primary    * Guss Bunde, MD - Assisting  ANESTHESIA:   general  EBL:  500 mL   BLOOD ADMINISTERED:none  DRAINS: none   LOCAL MEDICATIONS USED:  MARCAINE     SPECIMEN:  Source of Specimen:  Uterus, fallopian tubes and cervix   DISPOSITION OF SPECIMEN:  PATHOLOGY  COUNTS:  YES  TOURNIQUET:  * No tourniquets in log *  DICTATION: .Note written in EPIC  PLAN OF CARE: Admit to inpatient   PATIENT DISPOSITION:  PACU - hemodynamically stable.   Delay start of Pharmacological VTE agent (>24hrs) due to surgical blood loss or risk of bleeding: no  Complications: none immediate  Hillery Bhalla L. Harraway-Smith, M.D., [facility]

## 2017-12-15 NOTE — Anesthesia Procedure Notes (Signed)
Procedure Name: Intubation Date/Time: 12/15/2017 7:33 AM Performed by: Georgeanne Nim, CRNA Pre-anesthesia Checklist: Patient identified, Emergency Drugs available, Suction available, Patient being monitored and Timeout performed Patient Re-evaluated:Patient Re-evaluated prior to induction Oxygen Delivery Method: Circle system utilized Preoxygenation: Pre-oxygenation with 100% oxygen Induction Type: IV induction Ventilation: Mask ventilation without difficulty Laryngoscope Size: Mac and 3 Grade View: Grade I Tube type: Oral Tube size: 7.0 mm Number of attempts: 1 Airway Equipment and Method: Stylet Placement Confirmation: ETT inserted through vocal cords under direct vision,  positive ETCO2,  CO2 detector and breath sounds checked- equal and bilateral Secured at: 20 cm Tube secured with: Tape Dental Injury: Teeth and Oropharynx as per pre-operative assessment

## 2017-12-15 NOTE — Transfer of Care (Signed)
Immediate Anesthesia Transfer of Care Note  Patient: Caitlin Cain  Procedure(s) Performed: HYSTERECTOMY ABDOMINAL WITH SALPINGECTOMY (Bilateral Abdomen)  Patient Location: PACU  Anesthesia Type:General  Level of Consciousness: awake and patient cooperative  Airway & Oxygen Therapy: Patient Spontanous Breathing and Patient connected to nasal cannula oxygen  Post-op Assessment: Report given to RN and Post -op Vital signs reviewed and stable  Post vital signs: Reviewed and stable  Last Vitals:  Vitals:   12/15/17 0631  BP: 105/72  Pulse: (!) 56  Resp: 16  Temp: 36.5 C  SpO2: 100%    Last Pain:  Vitals:   12/15/17 0631  TempSrc: Oral      Patients Stated Pain Goal: 3 (79/89/21 1941)  Complications: No apparent anesthesia complications

## 2017-12-16 ENCOUNTER — Encounter (HOSPITAL_COMMUNITY): Payer: Self-pay | Admitting: Obstetrics & Gynecology

## 2017-12-16 LAB — CBC
HCT: 31.7 % — ABNORMAL LOW (ref 36.0–46.0)
HEMOGLOBIN: 10.9 g/dL — AB (ref 12.0–15.0)
MCH: 29.5 pg (ref 26.0–34.0)
MCHC: 34.4 g/dL (ref 30.0–36.0)
MCV: 85.9 fL (ref 78.0–100.0)
PLATELETS: 153 10*3/uL (ref 150–400)
RBC: 3.69 MIL/uL — AB (ref 3.87–5.11)
RDW: 12.2 % (ref 11.5–15.5)
WBC: 8.9 10*3/uL (ref 4.0–10.5)

## 2017-12-16 MED ORDER — OXYCODONE-ACETAMINOPHEN 5-325 MG PO TABS
2.0000 | ORAL_TABLET | ORAL | Status: DC | PRN
  Administered 2017-12-16 – 2017-12-17 (×6): 2 via ORAL
  Filled 2017-12-16 (×6): qty 2

## 2017-12-16 NOTE — Progress Notes (Signed)
1 Day Post-Op Procedure(s) (LRB): HYSTERECTOMY ABDOMINAL WITH SALPINGECTOMY (Bilateral)  Subjective: Patient reports incisional pain, tolerating PO and no problems voiding.    Objective: I have reviewed patient's vital signs, intake and output, medications and labs.  General: alert and no distress Resp: clear to auscultation bilaterally Cardio: regular rate and rhythm, S1, S2 normal, no murmur, click, rub or gallop GI: soft, non-tender; bowel sounds normal; no masses,  no organomegaly and incision: clean, dry and intact Vaginal Bleeding: minimal CBC    Component Value Date/Time   WBC 8.9 12/16/2017 0554   RBC 3.69 (L) 12/16/2017 0554   HGB 10.9 (L) 12/16/2017 0554   HCT 31.7 (L) 12/16/2017 0554   PLT 153 12/16/2017 0554   MCV 85.9 12/16/2017 0554   MCH 29.5 12/16/2017 0554   MCHC 34.4 12/16/2017 0554   RDW 12.2 12/16/2017 0554    Assessment: s/p Procedure(s): HYSTERECTOMY ABDOMINAL WITH SALPINGECTOMY (Bilateral): stable  Plan: Advance diet Encourage ambulation Advance to PO medication Discontinue IV fluids  Cont incentive spirometry Home in the am if cont to do well.  LOS: 1 day    Lavonia Drafts 12/16/2017, 8:40 AM

## 2017-12-17 ENCOUNTER — Ambulatory Visit: Payer: 59 | Admitting: Obstetrics & Gynecology

## 2017-12-17 MED ORDER — IBUPROFEN 600 MG PO TABS: 600.0000 mg | ORAL_TABLET | Freq: Four times a day (QID) | ORAL | 0 refills | Status: DC | PRN

## 2017-12-17 MED ORDER — DOCUSATE SODIUM 100 MG PO CAPS: 100.0000 mg | ORAL_CAPSULE | Freq: Two times a day (BID) | ORAL | 0 refills | Status: DC

## 2017-12-17 MED ORDER — OXYCODONE-ACETAMINOPHEN 5-325 MG PO TABS: 1.0000 | ORAL_TABLET | Freq: Four times a day (QID) | ORAL | 0 refills | Status: DC | PRN

## 2017-12-17 NOTE — Discharge Instructions (Signed)
Abdominal Hysterectomy, Care After °This sheet gives you information about how to care for yourself after your procedure. Your doctor may also give you more specific instructions. If you have problems or questions, contact your doctor. °Follow these instructions at home: °Bathing °· Do not take baths, swim, or use a hot tub until your doctor says it is okay. Ask your doctor if you can take showers. You may only be allowed to take sponge baths for bathing. °· Keep the bandage (dressing) dry until your doctor says it can be taken off. °Surgical cut ( °incision) care °· Follow instructions from your doctor about how to take care of your cut from surgery. Make sure you: °? Wash your hands with soap and water before you change your bandage (dressing). If you cannot use soap and water, use hand sanitizer. °? Change your bandage as told by your doctor. °? Leave stitches (sutures), skin glue, or skin tape (adhesive) strips in place. They may need to stay in place for 2 weeks or longer. If tape strips get loose and curl up, you may trim the loose edges. Do not remove tape strips completely unless your doctor says it is okay. °· Check your surgical cut area every day for signs of infection. Check for: °? Redness, swelling, or pain. °? Fluid or blood. °? Warmth. °? Pus or a bad smell. °Activity °· Do gentle, daily exercise as told by your doctor. You may be told to take short walks every day and go farther each time. °· Do not lift anything that is heavier than 10 lb (4.5 kg), or the limit that your doctor tells you, until he or she says that it is safe. °· Do not drive or use heavy machinery while taking prescription pain medicine. °· Do not drive for 24 hours if you were given a medicine to help you relax (sedative). °· Follow your doctor's advice about exercise, driving, and general activities. Ask your doctor what activities are safe for you. °Lifestyle °· Do not douche, use tampons, or have sex for at least 6 weeks or as  told by your doctor. °· Do not drink alcohol until your doctor says it is okay. °· Drink enough fluid to keep your pee (urine) clear or pale yellow. °· Try to have someone at home with you for the first 1-2 weeks to help. °· Do not use any products that contain nicotine or tobacco, such as cigarettes and e-cigarettes. These can slow down healing. If you need help quitting, ask your doctor. °General instructions °· Take over-the-counter and prescription medicines only as told by your doctor. °· Do not take aspirin or ibuprofen. These medicines can cause bleeding. °· To prevent or treat constipation while you are taking prescription pain medicine, your doctor may suggest that you: °? Drink enough fluid to keep your urine clear or pale yellow. °? Take over-the-counter or prescription medicines. °? Eat foods that are high in fiber, such as: °§ Fresh fruits and vegetables. °§ Whole grains. °§ Beans. °? Limit foods that are high in fat and processed sugars, such as fried and sweet foods. °· Keep all follow-up visits as told by your doctor. This is important. °Contact a doctor if: °· You have chills or fever. °· You have redness, swelling, or pain around your cut. °· You have fluid or blood coming from your cut. °· Your cut feels warm to the touch. °· You have pus or a bad smell coming from your cut. °· Your cut breaks   open. °· You feel dizzy or light-headed. °· You have pain or bleeding when you pee. °· You keep having watery poop (diarrhea). °· You keep feeling sick to your stomach (nauseous) or keep throwing up (vomiting). °· You have unusual fluid (discharge) coming from your vagina. °· You have a rash. °· You have a reaction to your medicine. °· Your pain medicine does not help. °Get help right away if: °· You have a fever and your symptoms get worse all of a sudden. °· You have very bad belly (abdominal) pain. °· You are short of breath. °· You pass out (faint). °· You have pain, swelling, or redness of your  leg. °· You bleed a lot from your vagina and notice clumps of blood (clots). °Summary °· Do not take baths, swim, or use a hot tub until your doctor says it is okay. Ask your doctor if you can take showers. You may only be allowed to take sponge baths for bathing. °· Follow your doctor's advice about exercise, driving, and general activities. Ask your doctor what activities are safe for you. °· Do not lift anything that is heavier than 10 lb (4.5 kg), or the limit that your doctor tells you, until he or she says that it is safe. °· Try to have someone at home with you for the first 1-2 weeks to help. °This information is not intended to replace advice given to you by your health care provider. Make sure you discuss any questions you have with your health care provider. °Document Released: 07/01/2008 Document Revised: 09/10/2016 Document Reviewed: 09/10/2016 °[employer] Interactive Patient Education © 2017 [employer] ° °

## 2017-12-17 NOTE — Discharge Summary (Signed)
Physician Discharge Summary  Patient ID: Caitlin Cain MRN: [id number] DOB/AGE: 1975/12/24 42 y.o.  Admit date: 12/15/2017 Discharge date: 12/17/2017  Admission Diagnoses: uterine fibroids  Discharge Diagnoses:  Principal Problem:   Fibroids Active Problems:   Menorrhagia   Post-operative state   Discharged Condition: good  Hospital Course: Patient had an uncomplicated surgery; for further details of this surgery, please refer to the operative note. Furthermore, the patient had an uncomplicated postoperative course.  By time of discharge, her pain was controlled on oral pain medications; she was ambulating, voiding without difficulty, tolerating regular diet and passing flatus.  She was deemed stable for discharge to home.    Consults: None  Significant Diagnostic Studies: labs: CBC  Treatments: surgery: TAH with bilateral salpipngectomy  Discharge Exam: Blood pressure (!) 102/57, pulse (!) 58, temperature 97.9 F (36.6 C), temperature source Oral, resp. rate 16, height 5\' 9"  (1.753 m), weight 162 lb (73.5 kg), SpO2 95 %, not currently breastfeeding. General appearance: alert and no distress Resp: clear to auscultation bilaterally Cardio: regular rate and rhythm, S1, S2 normal, no murmur, click, rub or gallop GI: soft, non-tender; bowel sounds normal; no masses,  no organomegaly Extremities: extremities normal, atraumatic, no cyanosis or edema Incision/Wound: dressing clean and dry  Disposition: 01-Home or Self Care  Discharge Instructions    Call MD for:  difficulty breathing, headache or visual disturbances   Complete by:  As directed    Call MD for:  extreme fatigue   Complete by:  As directed    Call MD for:  hives   Complete by:  As directed    Call MD for:  persistant dizziness or light-headedness   Complete by:  As directed    Call MD for:  persistant nausea and vomiting   Complete by:  As directed    Call MD for:  redness, tenderness, or signs of  infection (pain, swelling, redness, odor or green/yellow discharge around incision site)   Complete by:  As directed    Call MD for:  severe uncontrolled pain   Complete by:  As directed    Call MD for:  temperature >100.4   Complete by:  As directed    Diet general   Complete by:  As directed    Driving Restrictions   Complete by:  As directed    No driving for 2 weeks   Increase activity slowly   Complete by:  As directed    Lifting restrictions   Complete by:  As directed    No heavy lifting for 6 weeks   Sexual Activity Restrictions   Complete by:  As directed    No sexual intercourse for 6 weeks     Allergies as of 12/17/2017      Reactions   Adhesive [tape] Other (See Comments)   Red bumps and blisters   Codeine Swelling      Medication List    STOP taking these medications   lidocaine 5 % ointment Commonly known as:  XYLOCAINE   polyethylene glycol packet Commonly known as:  MIRALAX / GLYCOLAX     TAKE these medications   docusate sodium 100 MG capsule Commonly known as:  COLACE Take 1 capsule (100 mg total) by mouth 2 (two) times daily.   ibuprofen 600 MG tablet Commonly known as:  ADVIL,MOTRIN Take 1 tablet (600 mg total) by mouth every 6 (six) hours as needed (mild pain). What changed:    when to take this  reasons to  take this   oxyCODONE-acetaminophen 5-325 MG tablet Commonly known as:  PERCOCET/ROXICET Take 1 tablet by mouth every 6 (six) hours as needed for moderate pain ((when tolerating fluids)). What changed:    how much to take  when to take this  reasons to take this   PRENATAL VITAMINS PO Take 1 tablet by mouth daily.      Follow-up Information    [facility] Follow up in 2 week(s).   Specialty:  Internal Medicine Contact information: [address] [place] [phone]          Signed: Lavonia Drafts 12/17/2017, 7:54 AM

## 2017-12-18 ENCOUNTER — Encounter: Payer: Self-pay | Admitting: Obstetrics & Gynecology

## 2017-12-19 ENCOUNTER — Encounter: Payer: Self-pay | Admitting: Obstetrics & Gynecology

## 2017-12-22 ENCOUNTER — Ambulatory Visit (INDEPENDENT_AMBULATORY_CARE_PROVIDER_SITE_OTHER): Payer: 59 | Admitting: Obstetrics & Gynecology

## 2017-12-22 ENCOUNTER — Encounter: Payer: Self-pay | Admitting: Obstetrics & Gynecology

## 2017-12-22 VITALS — BP 122/71 | HR 81 | Resp 16 | Ht 69.0 in | Wt 159.0 lb

## 2017-12-22 DIAGNOSIS — R5383 Other fatigue: Secondary | ICD-10-CM

## 2017-12-22 DIAGNOSIS — Z9889 Other specified postprocedural states: Secondary | ICD-10-CM

## 2017-12-22 MED ORDER — OXYCODONE-ACETAMINOPHEN 5-325 MG PO TABS: 1.0000 | ORAL_TABLET | Freq: Four times a day (QID) | ORAL | 0 refills | Status: DC | PRN

## 2017-12-22 NOTE — Progress Notes (Signed)
   Subjective:    Patient ID: Caitlin Cain, female    DOB: 02/19/1976, 42 y.o.   MRN: [id number]  HPI  42 yo lady here for a post op visit after having a TAH on 12-15-17. She is requesting a refill of percocet as she ran out. She is taking IBU q 8 hours. She was able to have a BM yesterday after using dulcolax the day prior. She is taking miralax daily. She feels very fatigued and would like a check of her CBC.  Review of Systems With regard to her emotional well-being, she is already seeing a therapist and she and partner are in couples counseling.    Objective:   Physical Exam Breathing, conversing, and ambulating normally Well nourished, well hydrated White female, no apparent distress Abd- benign, incisions healing well     Assessment & Plan:  Fatigue- will check CBC, reassurance given, rec MVI daily, and rest Pain- I agreed to give her #10 more percocets She has another postop visit 01-04-18

## 2017-12-23 ENCOUNTER — Inpatient Hospital Stay (HOSPITAL_COMMUNITY)
Admission: AD | Admit: 2017-12-23 | Discharge: 2017-12-23 | Disposition: A | Discharge status: Home / Self Care | Payer: 59 | Source: Ambulatory Visit | Attending: Obstetrics and Gynecology | Admitting: Obstetrics and Gynecology

## 2017-12-23 ENCOUNTER — Inpatient Hospital Stay (HOSPITAL_COMMUNITY): Payer: 59

## 2017-12-23 ENCOUNTER — Telehealth: Payer: Self-pay | Admitting: *Deleted

## 2017-12-23 ENCOUNTER — Encounter (HOSPITAL_COMMUNITY): Payer: Self-pay | Admitting: *Deleted

## 2017-12-23 ENCOUNTER — Other Ambulatory Visit: Payer: Self-pay

## 2017-12-23 DIAGNOSIS — X58XXXA Exposure to other specified factors, initial encounter: Secondary | ICD-10-CM | POA: Diagnosis not present

## 2017-12-23 DIAGNOSIS — G8918 Other acute postprocedural pain: Secondary | ICD-10-CM | POA: Insufficient documentation

## 2017-12-23 DIAGNOSIS — S301XXA Contusion of abdominal wall, initial encounter: Secondary | ICD-10-CM | POA: Insufficient documentation

## 2017-12-23 LAB — CBC
HCT: 35.7 % (ref 35.0–45.0)
HCT: 38.4 % (ref 36.0–46.0)
HEMOGLOBIN: 12.6 g/dL (ref 11.7–15.5)
Hemoglobin: 13.4 g/dL (ref 12.0–15.0)
MCH: 30.1 pg (ref 27.0–33.0)
MCH: 30.2 pg (ref 26.0–34.0)
MCHC: 35.3 g/dL (ref 32.0–36.0)
MCHC: 34.9 g/dL (ref 30.0–36.0)
MCV: 85.4 fL (ref 80.0–100.0)
MCV: 86.5 fL (ref 78.0–100.0)
MPV: 9 fL (ref 7.5–12.5)
PLATELETS: 288 10*3/uL (ref 150–400)
Platelets: 280 10*3/uL (ref 140–400)
RBC: 4.18 10*6/uL (ref 3.80–5.10)
RBC: 4.44 MIL/uL (ref 3.87–5.11)
RDW: 12.3 % (ref 11.0–15.0)
RDW: 12.4 % (ref 11.5–15.5)
WBC: 6.3 10*3/uL (ref 3.8–10.8)
WBC: 7.1 10*3/uL (ref 4.0–10.5)

## 2017-12-23 LAB — URINALYSIS, ROUTINE W REFLEX MICROSCOPIC
Bilirubin Urine: NEGATIVE
Glucose, UA: NEGATIVE mg/dL
HGB URINE DIPSTICK: NEGATIVE
Ketones, ur: NEGATIVE mg/dL
Leukocytes, UA: NEGATIVE
Nitrite: NEGATIVE
Protein, ur: NEGATIVE mg/dL
SPECIFIC GRAVITY, URINE: 1.023 (ref 1.005–1.030)
pH: 8 (ref 5.0–8.0)

## 2017-12-23 LAB — COMPREHENSIVE METABOLIC PANEL
ALK PHOS: 57 U/L (ref 38–126)
ALT: 36 U/L (ref 14–54)
AST: 28 U/L (ref 15–41)
Albumin: 3.5 g/dL (ref 3.5–5.0)
Anion gap: 8 (ref 5–15)
BUN: 8 mg/dL (ref 6–20)
CALCIUM: 8.8 mg/dL — AB (ref 8.9–10.3)
CO2: 24 mmol/L (ref 22–32)
CREATININE: 0.76 mg/dL (ref 0.44–1.00)
Chloride: 104 mmol/L (ref 101–111)
GFR calc non Af Amer: >60 mL/min (ref >60)
Glucose, Bld: 86 mg/dL (ref 65–99)
Potassium: 4.2 mmol/L (ref 3.5–5.1)
SODIUM: 136 mmol/L (ref 135–145)
Total Bilirubin: 0.7 mg/dL (ref 0.3–1.2)
Total Protein: 6.6 g/dL (ref 6.5–8.1)

## 2017-12-23 MED ORDER — IOPAMIDOL (ISOVUE-300) INJECTION 61%
100.0000 mL | Freq: Once | INTRAVENOUS | Status: AC | PRN
  Administered 2017-12-23: 100 mL via INTRAVENOUS

## 2017-12-23 MED ORDER — KETOROLAC TROMETHAMINE 10 MG PO TABS: 10.0000 mg | ORAL_TABLET | Freq: Four times a day (QID) | ORAL | 0 refills | Status: DC | PRN

## 2017-12-23 MED ORDER — KETOROLAC TROMETHAMINE 60 MG/2ML IM SOLN
60.0000 mg | Freq: Once | INTRAMUSCULAR | Status: AC
  Administered 2017-12-23: 60 mg via INTRAMUSCULAR
  Filled 2017-12-23: qty 2

## 2017-12-23 MED ORDER — KETOROLAC TROMETHAMINE 30 MG/ML IJ SOLN
60.0000 mg | Freq: Once | INTRAMUSCULAR | Status: DC
  Filled 2017-12-23: qty 2

## 2017-12-23 MED ORDER — IOPAMIDOL (ISOVUE-300) INJECTION 61%
30.0000 mL | Freq: Once | INTRAVENOUS | Status: AC | PRN
  Administered 2017-12-23: 30 mL via ORAL

## 2017-12-23 NOTE — MAU Provider Note (Signed)
History     CSN: [id number]  Arrival date and time: 12/23/17 3382  Chief Complaint  Patient presents with  . Post-op Problem   42 y.o. female 8 days post -op from TAH, bilateral salpingectomy here with abdominal pain. Pain has been ongoing since surgery and worsening. Pain is localized to area superior of incision and worse on right. Pain feels superficial and near skin surface. Rates 7/10. Has taken Ibuprofen and Percocet with little relief. States had some dried bloody drainage on left of incision this am. Was seen yesterday in the office for the pain and honeycomb was removed. Denies fevers. No VB or discharge. Voiding and stooling w/o problem. Nothing inside vagina. Pt is teary, states "I've been telling everyone my pain is bad and no one is listening".    Past Medical History:  Diagnosis Date  . Abnormal Pap smear of cervix   . Anxiety    situational with pregnancy  . Gestational diabetes   . Scoliosis   . Skin cancer   . Uterine fibroid   . Vaginal Pap smear, abnormal     Past Surgical History:  Procedure Laterality Date  . CESAREAN SECTION N/A 11/03/2017   Procedure: PRIMARY CESAREAN SECTION;  Surgeon: Guss Bunde, MD;  Location: [facility];  Service: Obstetrics;  Laterality: N/A;  . HYSTERECTOMY ABDOMINAL WITH SALPINGECTOMY Bilateral 12/15/2017   Procedure: HYSTERECTOMY ABDOMINAL WITH SALPINGECTOMY;  Surgeon: Lavonia Drafts, MD;  Location: [facility];  Service: Gynecology;  Laterality: Bilateral;  . lymph node removal     neck benign    Family History  Problem Relation Age of Onset  . Breast cancer Mother   . Heart disease Father   . Breast cancer Maternal Aunt   . Cancer Maternal Grandfather   . Breast cancer Maternal Grandmother     Social History   Tobacco Use  . Smoking status: Never Smoker  . Smokeless tobacco: Never Used  Substance Use Topics  . Alcohol use: No  . Drug use: No    Allergies:  Allergies  Allergen Reactions  .  Adhesive [Tape] Other (See Comments)    Red bumps and blisters  . Codeine Swelling    Medications Prior to Admission  Medication Sig Dispense Refill Last Dose  . docusate sodium (COLACE) 100 MG capsule Take 1 capsule (100 mg total) by mouth 2 (two) times daily. 10 capsule 0 Taking  . ibuprofen (ADVIL,MOTRIN) 600 MG tablet Take 1 tablet (600 mg total) by mouth every 6 (six) hours as needed (mild pain). 30 tablet 0 Taking  . ondansetron (ZOFRAN) 4 MG tablet TK 1 TO 2 TS PO Q 8 H PRN NV  0 Taking  . oxyCODONE-acetaminophen (PERCOCET/ROXICET) 5-325 MG tablet Take 1 tablet by mouth every 6 (six) hours as needed for moderate pain ((when tolerating fluids)). 30 tablet 0 Taking  . oxyCODONE-acetaminophen (PERCOCET/ROXICET) 5-325 MG tablet Take 1-2 tablets by mouth every 6 (six) hours as needed. 30 tablet 0   . oxyCODONE-acetaminophen (PERCOCET/ROXICET) 5-325 MG tablet Take 1-2 tablets by mouth every 6 (six) hours as needed. 10 tablet 0   . Prenatal Multivit-Min-Fe-FA (PRENATAL VITAMINS PO) Take 1 tablet by mouth daily.    Taking    Review of Systems  Constitutional: Negative for chills and fever.  Gastrointestinal: Positive for abdominal pain. Negative for constipation, diarrhea, nausea and vomiting.  Genitourinary: Negative for dysuria, vaginal bleeding and vaginal discharge.   Physical Exam   Blood pressure (!) 115/55, pulse 69, temperature 99.2 F (37.3  C), temperature source Oral, resp. rate 18, weight 160 lb 12 oz (72.9 kg), last menstrual period 02/03/2017, SpO2 99 %, not currently breastfeeding.  Physical Exam  Nursing note and vitals reviewed. Constitutional: She is oriented to person, place, and time. She appears well-developed and well-nourished. She appears distressed (teary).  HENT:  Head: Normocephalic and atraumatic.  Neck: Normal range of motion.  Cardiovascular: Normal rate.  Respiratory: Effort normal. No respiratory distress.  GI: Soft. She exhibits no distension and no  mass. There is tenderness (above and below incision). There is no rebound and no guarding.  Genitourinary:  Genitourinary Comments: External: no lesions or erythema Vagina: rugated, pink, moist, scant brown discharge, cuff intact    Musculoskeletal: Normal range of motion.  Neurological: She is alert and oriented to person, place, and time.  Skin: Skin is warm and dry.     Psychiatric: Her mood appears anxious.   Results for orders placed or performed during the hospital encounter of 12/23/17 (from the past 24 hour(s))  Urinalysis, Routine w reflex microscopic     Status: Abnormal   Collection Time: 12/23/17 10:26 AM  Result Value Ref Range   Color, Urine YELLOW YELLOW   APPearance HAZY (A) CLEAR   Specific Gravity, Urine 1.023 1.005 - 1.030   pH 8.0 5.0 - 8.0   Glucose, UA NEGATIVE NEGATIVE mg/dL   Hgb urine dipstick NEGATIVE NEGATIVE   Bilirubin Urine NEGATIVE NEGATIVE   Ketones, ur NEGATIVE NEGATIVE mg/dL   Protein, ur NEGATIVE NEGATIVE mg/dL   Nitrite NEGATIVE NEGATIVE   Leukocytes, UA NEGATIVE NEGATIVE  Comprehensive metabolic panel     Status: Abnormal   Collection Time: 12/23/17 10:37 AM  Result Value Ref Range   Sodium 136 135 - 145 mmol/L   Potassium 4.2 3.5 - 5.1 mmol/L   Chloride 104 101 - 111 mmol/L   CO2 24 22 - 32 mmol/L   Glucose, Bld 86 65 - 99 mg/dL   BUN 8 6 - 20 mg/dL   Creatinine, Ser 0.76 0.44 - 1.00 mg/dL   Calcium 8.8 (L) 8.9 - 10.3 mg/dL   Total Protein 6.6 6.5 - 8.1 g/dL   Albumin 3.5 3.5 - 5.0 g/dL   AST 28 15 - 41 U/L   ALT 36 14 - 54 U/L   Alkaline Phosphatase 57 38 - 126 U/L   Total Bilirubin 0.7 0.3 - 1.2 mg/dL   GFR calc non Af Amer >60 >60 mL/min   GFR calc Af Amer >60 >60 mL/min   Anion gap 8 5 - 15  CBC     Status: None   Collection Time: 12/23/17 10:37 AM  Result Value Ref Range   WBC 7.1 4.0 - 10.5 K/uL   RBC 4.44 3.87 - 5.11 MIL/uL   Hemoglobin 13.4 12.0 - 15.0 g/dL   HCT 38.4 36.0 - 46.0 %   MCV 86.5 78.0 - 100.0 fL   MCH  30.2 26.0 - 34.0 pg   MCHC 34.9 30.0 - 36.0 g/dL   RDW 12.4 11.5 - 15.5 %   Platelets 288 150 - 400 K/uL   Ct Abdomen Pelvis W Contrast  Result Date: 12/23/2017 CLINICAL DATA:  Abdominal pain, fever, abscess EXAM: CT ABDOMEN AND PELVIS WITH CONTRAST TECHNIQUE: Multidetector CT imaging of the abdomen and pelvis was performed using the standard protocol following bolus administration of intravenous contrast. CONTRAST:  64mL ISOVUE-300 IOPAMIDOL (ISOVUE-300) INJECTION 61%, 137mL ISOVUE-300 IOPAMIDOL (ISOVUE-300) INJECTION 61% COMPARISON:  None. FINDINGS: Lower chest: Trace bilateral pleural  effusions. Hepatobiliary: No focal liver abnormality is seen. No gallstones, gallbladder wall thickening, or biliary dilatation. Pancreas: Unremarkable. No pancreatic ductal dilatation or surrounding inflammatory changes. Spleen: Normal in size without focal abnormality. Adrenals/Urinary Tract: Adrenal glands are unremarkable. No renal mass. No urolithiasis. Minimal hydronephrosis and dilatation of the right renal pelvis with dilatation of middle third of the right ureter with abrupt transition at the middle third-distal third of the ureter on the delayed images likely reflecting resolving compression of the ureter secondary to recent gravid uterus. Bladder is normal. Stomach/Bowel: Stomach is within normal limits. Appendix appears normal. No evidence of bowel wall thickening, distention, or inflammatory changes. Vascular/Lymphatic: No significant vascular findings are present. No enlarged abdominal or pelvic lymph nodes. Reproductive: Interval hysterectomy. Hazy inflammatory changes in the pelvis likely reflecting postsurgical changes given recent hysterectomy. Small amount of complex fluid in the cul-de-sac which may reflect a hematoma measuring 3.4 x 1.5 cm. Small hematoma in the pre vesicular space measuring 13 mm along the posterior margin of the right rectus abdominus muscle. Other: Postsurgical changes in the anterior  abdominal wall. Small bubble of air in the left anterior abdominal wall and in the rectus abdominus muscle subcutaneous consistent with postsurgical changes. Musculoskeletal: No acute osseous abnormality. No aggressive osseous lesion. Bilateral L5 pars interarticularis defects. IMPRESSION: 1. Interval hysterectomy. Hazy inflammatory changes in the pelvis likely reflecting postsurgical changes given recent hysterectomy. Small amount of complex fluid in the cul-de-sac which may reflect a hematoma measuring 3.4 x 1.5 cm. Small hematoma in the pre vesicular space measuring 13 mm along the posterior margin of the right rectus abdominus muscle. Overall findings are consistent with postsurgical changes. Electronically Signed   By: Kathreen Devoid   On: 12/23/2017 13:57   MAU Course  Procedures Toradol  MDM Labs and CT ordered. Pain improved. Dr. Gala Romney in to see pt. Suture removed. Plan for discharge home.  Assessment and Plan   1. Post-op pain   2. Abdominal wall hematoma, initial encounter    Discharge home Follow up in GYN office as scheduled Rx Toradol  Allergies as of 12/23/2017      Reactions   Adhesive [tape] Other (See Comments)   Red bumps and blisters   Codeine Swelling      Medication List    STOP taking these medications   ibuprofen 600 MG tablet Commonly known as:  ADVIL,MOTRIN     TAKE these medications   docusate sodium 100 MG capsule Commonly known as:  COLACE Take 1 capsule (100 mg total) by mouth 2 (two) times daily.   ketorolac 10 MG tablet Commonly known as:  TORADOL Take 1 tablet (10 mg total) by mouth every 6 (six) hours as needed.   ondansetron 4 MG tablet Commonly known as:  ZOFRAN TK 1 TO 2 TS PO Q 8 H PRN NV   oxyCODONE-acetaminophen 5-325 MG tablet Commonly known as:  PERCOCET/ROXICET Take 1 tablet by mouth every 6 (six) hours as needed for moderate pain ((when tolerating fluids)). What changed:  Another medication with the same name was removed.  Continue taking this medication, and follow the directions you see here.   PRENATAL VITAMINS PO Take 1 tablet by mouth daily.       Julianne Handler, CNM 12/23/2017, 2:38 PM

## 2017-12-23 NOTE — MAU Note (Addendum)
Dr Gala Romney  Wanted her to come in .  Had a abd hysterectomy last wk, had c/s 6 wks before.  Has a major hematoma, afraid it is going to pop open. Hurts really bad, denies fever.

## 2017-12-23 NOTE — Progress Notes (Signed)
Saline lock placed.

## 2017-12-23 NOTE — MAU Note (Signed)
Call to pt after discharge, Rx given to this pt that was for another pt . Pt states she just realized it and they have destroyed it , RN apologized to pt and thanked her for destroying the RX

## 2017-12-23 NOTE — Telephone Encounter (Signed)
Pt notified of normal CBC.  She will continue to monitor her incision and call if pain increases.

## 2018-01-04 ENCOUNTER — Encounter: Payer: Self-pay | Admitting: Obstetrics & Gynecology

## 2018-01-04 ENCOUNTER — Ambulatory Visit (INDEPENDENT_AMBULATORY_CARE_PROVIDER_SITE_OTHER): Payer: 59 | Admitting: Obstetrics & Gynecology

## 2018-01-04 VITALS — BP 112/75 | HR 93 | Resp 16 | Ht 69.0 in | Wt 157.0 lb

## 2018-01-04 DIAGNOSIS — Z9889 Other specified postprocedural states: Secondary | ICD-10-CM

## 2018-01-04 NOTE — Patient Instructions (Signed)
Abdominal Hysterectomy, Care After °This sheet gives you information about how to care for yourself after your procedure. Your doctor may also give you more specific instructions. If you have problems or questions, contact your doctor. °Follow these instructions at home: °Bathing °· Do not take baths, swim, or use a hot tub until your doctor says it is okay. Ask your doctor if you can take showers. You may only be allowed to take sponge baths for bathing. °· Keep the bandage (dressing) dry until your doctor says it can be taken off. °Surgical cut ( °incision) care °· Follow instructions from your doctor about how to take care of your cut from surgery. Make sure you: °? Wash your hands with soap and water before you change your bandage (dressing). If you cannot use soap and water, use hand sanitizer. °? Change your bandage as told by your doctor. °? Leave stitches (sutures), skin glue, or skin tape (adhesive) strips in place. They may need to stay in place for 2 weeks or longer. If tape strips get loose and curl up, you may trim the loose edges. Do not remove tape strips completely unless your doctor says it is okay. °· Check your surgical cut area every day for signs of infection. Check for: °? Redness, swelling, or pain. °? Fluid or blood. °? Warmth. °? Pus or a bad smell. °Activity °· Do gentle, daily exercise as told by your doctor. You may be told to take short walks every day and go farther each time. °· Do not lift anything that is heavier than 10 lb (4.5 kg), or the limit that your doctor tells you, until he or she says that it is safe. °· Do not drive or use heavy machinery while taking prescription pain medicine. °· Do not drive for 24 hours if you were given a medicine to help you relax (sedative). °· Follow your doctor's advice about exercise, driving, and general activities. Ask your doctor what activities are safe for you. °Lifestyle °· Do not douche, use tampons, or have sex for at least 6 weeks or as  told by your doctor. °· Do not drink alcohol until your doctor says it is okay. °· Drink enough fluid to keep your pee (urine) clear or pale yellow. °· Try to have someone at home with you for the first 1-2 weeks to help. °· Do not use any products that contain nicotine or tobacco, such as cigarettes and e-cigarettes. These can slow down healing. If you need help quitting, ask your doctor. °General instructions °· Take over-the-counter and prescription medicines only as told by your doctor. °· Do not take aspirin or ibuprofen. These medicines can cause bleeding. °· To prevent or treat constipation while you are taking prescription pain medicine, your doctor may suggest that you: °? Drink enough fluid to keep your urine clear or pale yellow. °? Take over-the-counter or prescription medicines. °? Eat foods that are high in fiber, such as: °§ Fresh fruits and vegetables. °§ Whole grains. °§ Beans. °? Limit foods that are high in fat and processed sugars, such as fried and sweet foods. °· Keep all follow-up visits as told by your doctor. This is important. °Contact a doctor if: °· You have chills or fever. °· You have redness, swelling, or pain around your cut. °· You have fluid or blood coming from your cut. °· Your cut feels warm to the touch. °· You have pus or a bad smell coming from your cut. °· Your cut breaks   open. °· You feel dizzy or light-headed. °· You have pain or bleeding when you pee. °· You keep having watery poop (diarrhea). °· You keep feeling sick to your stomach (nauseous) or keep throwing up (vomiting). °· You have unusual fluid (discharge) coming from your vagina. °· You have a rash. °· You have a reaction to your medicine. °· Your pain medicine does not help. °Get help right away if: °· You have a fever and your symptoms get worse all of a sudden. °· You have very bad belly (abdominal) pain. °· You are short of breath. °· You pass out (faint). °· You have pain, swelling, or redness of your  leg. °· You bleed a lot from your vagina and notice clumps of blood (clots). °Summary °· Do not take baths, swim, or use a hot tub until your doctor says it is okay. Ask your doctor if you can take showers. You may only be allowed to take sponge baths for bathing. °· Follow your doctor's advice about exercise, driving, and general activities. Ask your doctor what activities are safe for you. °· Do not lift anything that is heavier than 10 lb (4.5 kg), or the limit that your doctor tells you, until he or she says that it is safe. °· Try to have someone at home with you for the first 1-2 weeks to help. °This information is not intended to replace advice given to you by your health care provider. Make sure you discuss any questions you have with your health care provider. °Document Released: 07/01/2008 Document Revised: 09/10/2016 Document Reviewed: 09/10/2016 °[employer] Interactive Patient Education © 2017 [employer] ° °

## 2018-01-07 ENCOUNTER — Encounter: Payer: Self-pay | Admitting: Obstetrics & Gynecology

## 2018-01-07 NOTE — Progress Notes (Signed)
History:  42 y.o. G1P1001 here today for routine 2 week post op check.  Pt is s/p TAH with bilateral salpingectomy. Pt reports that directly after surgery she was having significant pain. She was seen by Dr. Gala Romney and a CT scan was ordered which was unremarkable. She reports that the severe pain has resolved at present. She is now tolerating a reg diet, voiding without difficulty and passing flatus.  Pt has done some work and has even attended a conf without problems.   The following portions of the patient's history were reviewed and updated as appropriate: allergies, current medications, past family history, past medical history, past social history, past surgical history and problem list.  Review of Systems:  Pertinent items are noted in HPI.    Objective:  Physical Exam Blood pressure 112/75, pulse 93, resp. rate 16, height 5\' 9"  (1.753 m), weight 157 lb (71.2 kg), last menstrual period 02/03/2017, not currently breastfeeding.  CONSTITUTIONAL: Well-developed, well-nourished female in no acute distress.  HENT:  Normocephalic, atraumatic EYES: Conjunctivae and EOM are normal. No scleral icterus.  NECK: Normal range of motion SKIN: Skin is warm and dry. No rash noted. Not diaphoretic.No pallor. [place]: Alert and oriented to person, place, and time. Normal coordination.  Abd: Soft, nontender and nondistended; well healed vertical skin incision and healing transverse incision.  Pelvic: deferred  Labs and Imaging Ct Abdomen Pelvis W Contrast  Result Date: 12/23/2017 CLINICAL DATA:  Abdominal pain, fever, abscess EXAM: CT ABDOMEN AND PELVIS WITH CONTRAST TECHNIQUE: Multidetector CT imaging of the abdomen and pelvis was performed using the standard protocol following bolus administration of intravenous contrast. CONTRAST:  40mL ISOVUE-300 IOPAMIDOL (ISOVUE-300) INJECTION 61%, 173mL ISOVUE-300 IOPAMIDOL (ISOVUE-300) INJECTION 61% COMPARISON:  None. FINDINGS: Lower chest: Trace bilateral  pleural effusions. Hepatobiliary: No focal liver abnormality is seen. No gallstones, gallbladder wall thickening, or biliary dilatation. Pancreas: Unremarkable. No pancreatic ductal dilatation or surrounding inflammatory changes. Spleen: Normal in size without focal abnormality. Adrenals/Urinary Tract: Adrenal glands are unremarkable. No renal mass. No urolithiasis. Minimal hydronephrosis and dilatation of the right renal pelvis with dilatation of middle third of the right ureter with abrupt transition at the middle third-distal third of the ureter on the delayed images likely reflecting resolving compression of the ureter secondary to recent gravid uterus. Bladder is normal. Stomach/Bowel: Stomach is within normal limits. Appendix appears normal. No evidence of bowel wall thickening, distention, or inflammatory changes. Vascular/Lymphatic: No significant vascular findings are present. No enlarged abdominal or pelvic lymph nodes. Reproductive: Interval hysterectomy. Hazy inflammatory changes in the pelvis likely reflecting postsurgical changes given recent hysterectomy. Small amount of complex fluid in the cul-de-sac which may reflect a hematoma measuring 3.4 x 1.5 cm. Small hematoma in the pre vesicular space measuring 13 mm along the posterior margin of the right rectus abdominus muscle. Other: Postsurgical changes in the anterior abdominal wall. Small bubble of air in the left anterior abdominal wall and in the rectus abdominus muscle subcutaneous consistent with postsurgical changes. Musculoskeletal: No acute osseous abnormality. No aggressive osseous lesion. Bilateral L5 pars interarticularis defects. IMPRESSION: 1. Interval hysterectomy. Hazy inflammatory changes in the pelvis likely reflecting postsurgical changes given recent hysterectomy. Small amount of complex fluid in the cul-de-sac which may reflect a hematoma measuring 3.4 x 1.5 cm. Small hematoma in the pre vesicular space measuring 13 mm along the  posterior margin of the right rectus abdominus muscle. Overall findings are consistent with postsurgical changes. Electronically Signed   By: Kathreen Devoid   On: 12/23/2017  13:57    Assessment & Plan:  Routine post op check  Pt is doing well  Gradual return to full activities.   F/u in 4 weeks will check vaginal cuff at that time.   Nothing per vagina   Jin Capote L. Harraway-Smith, M.D., Cherlynn June

## 2018-01-25 ENCOUNTER — Encounter: Payer: Self-pay | Admitting: Obstetrics & Gynecology

## 2018-01-25 ENCOUNTER — Ambulatory Visit (INDEPENDENT_AMBULATORY_CARE_PROVIDER_SITE_OTHER): Payer: 59 | Admitting: Obstetrics & Gynecology

## 2018-01-25 VITALS — BP 121/71 | HR 77 | Ht 69.0 in | Wt 153.0 lb

## 2018-01-25 DIAGNOSIS — B373 Candidiasis of vulva and vagina: Secondary | ICD-10-CM | POA: Diagnosis not present

## 2018-01-25 DIAGNOSIS — N898 Other specified noninflammatory disorders of vagina: Secondary | ICD-10-CM | POA: Diagnosis not present

## 2018-01-25 DIAGNOSIS — Z9889 Other specified postprocedural states: Secondary | ICD-10-CM

## 2018-01-25 DIAGNOSIS — B3731 Acute candidiasis of vulva and vagina: Secondary | ICD-10-CM

## 2018-01-25 NOTE — Progress Notes (Signed)
History:  42 y.o. G1P1001 here today for 6 week post op check. Pt is doing well. She reports some discharge for the past 3 days. She denies itching or other assoc sx. Her post op course has gone well. She has returned to work and has no problems. She is voiding and passing BM without difficulty. She has not resumed sexual activity.    The following portions of the patient's history were reviewed and updated as appropriate: allergies, current medications, past family history, past medical history, past social history, past surgical history and problem list.  Review of Systems:  Pertinent items are noted in HPI.    Objective:  Physical Exam Blood pressure 121/71, pulse 77, weight 153 lb (69.4 kg), last menstrual period 02/03/2017, not currently breastfeeding. CONSTITUTIONAL: Well-developed, well-nourished female in no acute distress.  HENT:  Normocephalic, atraumatic EYES: Conjunctivae and EOM are normal. No scleral icterus.  NECK: Normal range of motion SKIN: Skin is warm and dry. No rash noted. Not diaphoretic.No pallor. [place]: Alert and oriented to person, place, and time. Normal coordination.  Abd: Soft, nontender and nondistended; well healed vertical and transverse incisons  Pelvic: Normal appearing external genitalia; normal appearing vaginal mucosa and vaginal cuff. There is a small amount of white discharge that looks clumpy. No adnexal tenderness or masses noted.    Assessment & Plan:  6 week post op check  Doing well  May f/u with primary GYN, Dr. Gala Romney for her 3 month check  May return to full activities including intercourse.   Vaginal discharge- I suspect yeast  F/u Affirm  Jenean Escandon L. Harraway-Smith, M.D., Cherlynn June

## 2018-01-26 LAB — CERVICOVAGINAL ANCILLARY ONLY
Bacterial vaginitis: NEGATIVE
Candida vaginitis: POSITIVE — AB

## 2018-01-27 MED ORDER — FLUCONAZOLE 150 MG PO TABS: 150.0000 mg | ORAL_TABLET | Freq: Once | ORAL | 0 refills | Status: AC

## 2018-01-27 NOTE — Addendum Note (Signed)
Addended by: Lavonia Drafts on: 01/27/2018 11:19 AM   Modules accepted: Orders

## 2018-04-29 MED FILL — IMIQUIMOD 5% CREAM PACKET: 5 | 28 days supply | Qty: 12 | Fill #0

## 2018-06-30 ENCOUNTER — Ambulatory Visit: Payer: 59

## 2018-06-30 DIAGNOSIS — N898 Other specified noninflammatory disorders of vagina: Secondary | ICD-10-CM

## 2018-06-30 NOTE — Progress Notes (Signed)
Pt here for vaginal discharge and itching. Wet prep sent. PT is aware we will call her once results come back and treat if necessary.

## 2018-07-01 ENCOUNTER — Telehealth: Payer: Self-pay

## 2018-07-01 LAB — WET PREP FOR TRICH, YEAST, CLUE
MICRO NUMBER: 91153503
Specimen Quality: ADEQUATE

## 2018-07-01 NOTE — Telephone Encounter (Signed)
Spoke with pt and she is aware that her wet prep results were negative

## 2018-07-05 ENCOUNTER — Other Ambulatory Visit: Payer: Self-pay | Admitting: Obstetrics & Gynecology

## 2018-07-05 ENCOUNTER — Telehealth: Payer: Self-pay | Admitting: Obstetrics & Gynecology

## 2018-07-05 DIAGNOSIS — F4310 Post-traumatic stress disorder, unspecified: Secondary | ICD-10-CM | POA: Diagnosis not present

## 2018-07-05 DIAGNOSIS — F419 Anxiety disorder, unspecified: Secondary | ICD-10-CM

## 2018-07-05 MED ORDER — ALPRAZOLAM 0.25 MG PO TABS: ORAL_TABLET | ORAL | 0 refills | Status: DC

## 2018-07-05 MED ORDER — DULOXETINE HCL 30 MG PO CPEP: 30.0000 mg | ORAL_CAPSULE | Freq: Every day | ORAL | 1 refills | Status: DC
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
## 2018-07-19 ENCOUNTER — Encounter: Payer: Self-pay | Admitting: Obstetrics & Gynecology

## 2018-07-19 ENCOUNTER — Telehealth: Payer: Self-pay | Admitting: Obstetrics & Gynecology

## 2018-07-19 ENCOUNTER — Ambulatory Visit: Payer: 59 | Admitting: Obstetrics & Gynecology

## 2018-07-19 VITALS — BP 108/67 | HR 85 | Ht 69.0 in | Wt 141.0 lb

## 2018-07-19 DIAGNOSIS — F4310 Post-traumatic stress disorder, unspecified: Secondary | ICD-10-CM | POA: Diagnosis not present

## 2018-07-19 DIAGNOSIS — F419 Anxiety disorder, unspecified: Secondary | ICD-10-CM | POA: Diagnosis not present

## 2018-07-19 DIAGNOSIS — M6208 Separation of muscle (nontraumatic), other site: Secondary | ICD-10-CM | POA: Diagnosis not present

## 2018-07-19 NOTE — Progress Notes (Signed)
   Subjective:    Patient ID: Caitlin Cain, female    DOB: [birth year], 42 y.o.   MRN: [id number]  HPI  42 yo female who presents for follow-up of anxiety.  Patient did not fill good on the Cymbalta or Xanax.  She has stopped both of them.  She does see her counselor once a week at [facility] which has helped her.  Patient also moved in with her mother which is helping during the transition.  Of separating from Goodwin, father of baby.  Patient would like a referral to a [facility] psychiatrist to help with medication prescribing.  Dr. Dwyane Dee was sent a message and I will be waiting for her response to get Caitlin Cain and to a psychiatrist in [place]. She is complaining of rectus muscle separation after her surgeries.  She also removed a portion of the suture from her hysterectomy incision recently.  Review of Systems  Constitutional: Negative.   Respiratory: Negative.   Cardiovascular: Negative.   Gastrointestinal:       Rectus muscle bulge  Genitourinary: Negative.   Psychiatric/Behavioral: Negative.        Objective:   Physical Exam  Constitutional: She is oriented to person, place, and time. She appears well-developed and well-nourished. No distress.  HENT:  Head: Normocephalic and atraumatic.  Eyes: Conjunctivae are normal.  Cardiovascular: Normal rate.  Pulmonary/Chest: Effort normal.  Abdominal: Soft. Bowel sounds are normal. She exhibits no distension and no mass. There is no tenderness. There is no rebound and no guarding.  Approximately 3 cm rectus diathesis.  Incision now well-healed with no suture extruding.  Musculoskeletal: She exhibits no edema.  Neurological: She is alert and oriented to person, place, and time.  Skin: Skin is warm and dry.  Psychiatric: She has a normal mood and affect.  Vitals reviewed.  Vitals:   07/19/18 1027  BP: 108/67  Pulse: 85  Weight: 141 lb (64 kg)  Height: 5\' 9"  (1.753 m)      Assessment & Plan:  42 year old female with  anxiety and side effects from the medications.  Rectus muscle diathesis. 1.  Referral to [facility] health behavioral health.  In Dr. Ronnie Derby recommendations of appropriate psychiatrist. 2.  Referral to physical therapy for postsurgical rectus separation.  30 minutes spent face-to-face with patient greater than 50% counseling

## 2018-07-19 NOTE — Telephone Encounter (Signed)
5 minutes was spent on the phone with patient.  Greater than 50% was counseling.  Indications ordered as described.

## 2018-07-19 NOTE — Telephone Encounter (Signed)
Entered in error

## 2018-07-20 ENCOUNTER — Ambulatory Visit: Payer: 59 | Admitting: Physical Therapy

## 2018-07-29 ENCOUNTER — Ambulatory Visit: Payer: 59 | Attending: Obstetrics & Gynecology | Admitting: Physical Therapy

## 2018-07-29 ENCOUNTER — Other Ambulatory Visit: Payer: Self-pay

## 2018-07-29 ENCOUNTER — Encounter: Payer: Self-pay | Admitting: Physical Therapy

## 2018-07-29 DIAGNOSIS — M6281 Muscle weakness (generalized): Secondary | ICD-10-CM

## 2018-07-29 DIAGNOSIS — M6208 Separation of muscle (nontraumatic), other site: Secondary | ICD-10-CM
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
## 2018-07-29 NOTE — Patient Instructions (Signed)
Unilateral Isometric Hip Flexion    Tighten stomach and raise right knee to outstretched arm. Push gently, keeping arm straight, trunk rigid. Hold __5__ seconds. Repeat __10__ times per set. Do __1__ sets per session. Do ___1_ sessions per day.  [email or web address]  [place] [address] [place],  [place] [address] Phone # [phone] Fax [phone]  Copyright  VHI. All rights reserved.

## 2018-08-11 ENCOUNTER — Encounter: Payer: Self-pay | Admitting: Physical Therapy

## 2018-08-11 ENCOUNTER — Ambulatory Visit: Payer: 59 | Attending: Obstetrics & Gynecology | Admitting: Physical Therapy

## 2018-08-11 DIAGNOSIS — M6208 Separation of muscle (nontraumatic), other site: Secondary | ICD-10-CM | POA: Insufficient documentation

## 2018-08-11 DIAGNOSIS — M6281 Muscle weakness (generalized): Secondary | ICD-10-CM | POA: Insufficient documentation
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
## 2018-08-11 NOTE — Patient Instructions (Signed)
Access Code: [email or web address]  URL: [email or web address]  Date: 08/11/2018  Prepared by: Earlie Counts   Exercises  Supine Transversus Abdominis Bracing with Double Leg Fallout - 10 reps - 1 sets - 5 sec hold - 2-3x daily - 7x weekly [facility] [address], [place], [place] [address] Phone # [phone] Fax [phone]

## 2018-08-25 ENCOUNTER — Ambulatory Visit: Payer: 59 | Admitting: Physical Therapy

## 2018-08-27 ENCOUNTER — Encounter

## 2018-09-08 ENCOUNTER — Ambulatory Visit: Payer: 59 | Attending: Obstetrics & Gynecology | Admitting: Physical Therapy

## 2018-09-08 ENCOUNTER — Encounter: Payer: Self-pay | Admitting: Physical Therapy

## 2018-09-08 DIAGNOSIS — M6208 Separation of muscle (nontraumatic), other site: Secondary | ICD-10-CM | POA: Diagnosis present

## 2018-09-08 DIAGNOSIS — M6281 Muscle weakness (generalized): Secondary | ICD-10-CM | POA: Diagnosis not present

## 2018-09-08 NOTE — Patient Instructions (Signed)
Access Code: [email or web address]  URL: [email or web address]  Date: 09/08/2018  Prepared by: Earlie Counts   Exercises  Supine Transversus Abdominis Bracing with Double Leg Fallout - 10 reps - 1 sets - 5 sec hold - 2-3x daily - 7x weekly  Supine Transversus Abdominis Bracing with Leg Extension - 10 reps - 2 sets - 1x daily - 7x weekly  Hooklying Sequential Leg March and Lower - 10 reps - 2 sets - 1x daily - 7x weekly  Supine Core Bicycle - 10 reps - 2 sets - 1x daily - 7x weekly  [facility] [address], [place], [place] [address] Phone # [phone] Fax [phone]
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
## 2018-09-22 ENCOUNTER — Encounter: Payer: Self-pay | Admitting: Physical Therapy

## 2018-09-22 ENCOUNTER — Ambulatory Visit: Payer: 59 | Admitting: Physical Therapy

## 2018-09-22 DIAGNOSIS — M6281 Muscle weakness (generalized): Secondary | ICD-10-CM | POA: Diagnosis not present

## 2018-09-22 DIAGNOSIS — M6208 Separation of muscle (nontraumatic), other site: Secondary | ICD-10-CM
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
## 2018-09-22 NOTE — Patient Instructions (Signed)
Access Code: [email or web address] URL: [email or web address]  Date: 09/22/2018  Prepared by: Earlie Counts   Exercises  Supine Transversus Abdominis Bracing with Double Leg Fallout - 10 reps - 1 sets - 5 sec hold - 2-3x daily - 7x weekly  Supine Transversus Abdominis Bracing with Leg Extension - 10 reps - 2 sets - 1x daily - 7x weekly  Hooklying Sequential Leg March and Lower - 10 reps - 2 sets - 1x daily - 7x weekly  Supine Core Bicycle - 10 reps - 1 sets - 1x daily - 7x weekly  Sidelying Hip Circles - 10 reps - 2 sets - 1x daily - 7x weekly  Sidelying Hip Adduction - 10 reps - 3 sets - 1x daily - 7x weekly [facility] [address], [place], [place] [address] Phone # [phone] Fax [phone]

## 2018-10-13 ENCOUNTER — Ambulatory Visit: Payer: BLUE CROSS/BLUE SHIELD | Admitting: Physical Therapy

## 2018-10-19 ENCOUNTER — Ambulatory Visit: Payer: BLUE CROSS/BLUE SHIELD | Attending: Obstetrics & Gynecology | Admitting: Physical Therapy

## 2018-10-19 ENCOUNTER — Encounter: Payer: Self-pay | Admitting: Physical Therapy

## 2018-10-19 DIAGNOSIS — M6208 Separation of muscle (nontraumatic), other site: Secondary | ICD-10-CM | POA: Diagnosis present

## 2018-10-19 DIAGNOSIS — M6281 Muscle weakness (generalized): Secondary | ICD-10-CM | POA: Diagnosis present

## 2018-10-19 NOTE — Patient Instructions (Signed)
Access Code: [email or web address]  URL: [email or web address]  Date: 10/19/2018  Prepared by: Earlie Counts   Exercises  Hooklying Sequential Leg March and Lower - 10 reps - 2 sets - 1x daily - 7x weekly  Supine Core Bicycle - 10 reps - 1 sets - 1x daily - 7x weekly  Curl Up with Reach - 10 reps - 2 sets - 1x daily - 7x weekly  Sidelying Hip Circles - 10 reps - 2 sets - 1x daily - 7x weekly  Diagonal Curl Up with Reach - 10 reps - 1 sets - 1x daily - 7x weekly  Sidelying Hip Adduction - 10 reps - 3 sets - 1x daily - 7x weekly  Beginner Single Leg Circle - 10 reps - 1 sets - 1x daily - 7x weekly  [facility] [address] [place], [place] [address] Phone # [phone] Fax [phone]
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
## 2019-07-04 DIAGNOSIS — F4322 Adjustment disorder with anxiety: Secondary | ICD-10-CM | POA: Diagnosis not present

## 2019-07-04 DIAGNOSIS — F900 Attention-deficit hyperactivity disorder, predominantly inattentive type: Secondary | ICD-10-CM | POA: Diagnosis not present

## 2019-07-05 ENCOUNTER — Encounter: Payer: Self-pay | Admitting: *Deleted

## 2019-07-29 DIAGNOSIS — Z20828 Contact with and (suspected) exposure to other viral communicable diseases: Secondary | ICD-10-CM | POA: Diagnosis not present

## 2019-08-26 DIAGNOSIS — F900 Attention-deficit hyperactivity disorder, predominantly inattentive type: Secondary | ICD-10-CM | POA: Diagnosis not present

## 2019-08-26 DIAGNOSIS — F4322 Adjustment disorder with anxiety: Secondary | ICD-10-CM | POA: Diagnosis not present

## 2019-08-27 DIAGNOSIS — Z20828 Contact with and (suspected) exposure to other viral communicable diseases: Secondary | ICD-10-CM | POA: Diagnosis not present

## 2019-09-13 DIAGNOSIS — F4310 Post-traumatic stress disorder, unspecified: Secondary | ICD-10-CM | POA: Diagnosis not present

## 2019-10-11 DIAGNOSIS — Z20828 Contact with and (suspected) exposure to other viral communicable diseases: Secondary | ICD-10-CM | POA: Diagnosis not present

## 2019-10-17 DIAGNOSIS — Z20828 Contact with and (suspected) exposure to other viral communicable diseases: Secondary | ICD-10-CM | POA: Diagnosis not present

## 2019-10-19 DIAGNOSIS — F4322 Adjustment disorder with anxiety: Secondary | ICD-10-CM | POA: Diagnosis not present

## 2019-10-19 DIAGNOSIS — F900 Attention-deficit hyperactivity disorder, predominantly inattentive type: Secondary | ICD-10-CM | POA: Diagnosis not present

## 2019-11-10 DIAGNOSIS — F4310 Post-traumatic stress disorder, unspecified: Secondary | ICD-10-CM | POA: Diagnosis not present

## 2019-12-06 DIAGNOSIS — F900 Attention-deficit hyperactivity disorder, predominantly inattentive type: Secondary | ICD-10-CM | POA: Diagnosis not present

## 2019-12-06 DIAGNOSIS — F4322 Adjustment disorder with anxiety: Secondary | ICD-10-CM | POA: Diagnosis not present

## 2020-01-05 DIAGNOSIS — M1711 Unilateral primary osteoarthritis, right knee: Secondary | ICD-10-CM

## 2020-01-05 DIAGNOSIS — M25561 Pain in right knee: Secondary | ICD-10-CM | POA: Diagnosis not present

## 2020-01-05 DIAGNOSIS — S83241A Other tear of medial meniscus, current injury, right knee, initial encounter: Secondary | ICD-10-CM | POA: Diagnosis not present

## 2020-01-05 HISTORY — DX: Unilateral primary osteoarthritis, right knee: M17.11

## 2020-01-18 DIAGNOSIS — M25561 Pain in right knee: Secondary | ICD-10-CM | POA: Diagnosis not present

## 2020-01-25 ENCOUNTER — Other Ambulatory Visit: Payer: Self-pay

## 2020-01-25 ENCOUNTER — Encounter: Payer: Self-pay | Admitting: Plastic Surgery

## 2020-01-25 ENCOUNTER — Ambulatory Visit (INDEPENDENT_AMBULATORY_CARE_PROVIDER_SITE_OTHER): Payer: 59 | Admitting: Plastic Surgery

## 2020-01-25 VITALS — BP 125/78 | HR 90 | Temp 96.9°F | Ht 69.0 in | Wt 145.2 lb

## 2020-01-25 DIAGNOSIS — C44319 Basal cell carcinoma of skin of other parts of face: Secondary | ICD-10-CM

## 2020-01-25 NOTE — Progress Notes (Signed)
Referring Provider No referring provider defined for this encounter.   CC:  Chief Complaint  Patient presents with  . Advice Only    for basal cell removal on forehead and (L) shoulder      Caitlin Cain is an 44 y.o. female.  HPI: Patient presents with 2 concerning skin lesions.  The first is on her forehead and has been present for around a year.  She says it ulcerates and does not heal.  She has had a basal cell excised in a different location on her forehead before.  The current lesion has not yet been biopsied.  She also has a similar lesion on her left shoulder that has not healed.  It will intermittently bleed.  She would like for both of these to be removed as she suspects basal cells in both areas.  Allergies  Allergen Reactions  . Adhesive [Tape] Other (See Comments)    Red bumps and blisters  . Codeine Swelling    Outpatient Encounter Medications as of 01/25/2020  Medication Sig  . OVER THE COUNTER MEDICATION Multiple Vitamin-Gummie-Take 1 by mouth daily.  . [DISCONTINUED] Multiple Vitamin (MULTIVITAMIN) tablet Take 1 tablet by mouth daily.  . [DISCONTINUED] ALPRAZolam (XANAX) 0.25 MG tablet Take one tablet daily as needed for anxiety. (Patient not taking: Reported on 07/19/2018)  . [DISCONTINUED] docusate sodium (COLACE) 100 MG capsule Take 1 capsule (100 mg total) by mouth 2 (two) times daily. (Patient not taking: Reported on 07/19/2018)  . [DISCONTINUED] DULoxetine (CYMBALTA) 30 MG capsule Take 1 capsule (30 mg total) by mouth daily. Take one pill per day for 1 week and increase to 2 pills daily. (Patient not taking: Reported on 07/19/2018)  . [DISCONTINUED] ondansetron (ZOFRAN) 4 MG tablet TK 1 TO 2 TS PO Q 8 H PRN NV  . [DISCONTINUED] Prenatal Multivit-Min-Fe-FA (PRENATAL VITAMINS PO) Take 1 tablet by mouth daily.    No facility-administered encounter medications on file as of 01/25/2020.     Past Medical History:  Diagnosis Date  . Abnormal Pap smear of  cervix   . Anxiety    situational with pregnancy  . Gestational diabetes   . Scoliosis   . Skin cancer   . Uterine fibroid   . Vaginal Pap smear, abnormal     Past Surgical History:  Procedure Laterality Date  . CESAREAN SECTION N/A 11/03/2017   Procedure: PRIMARY CESAREAN SECTION;  Surgeon: Guss Bunde, MD;  Location: [place];  Service: Obstetrics;  Laterality: N/A;  . HYSTERECTOMY ABDOMINAL WITH SALPINGECTOMY Bilateral 12/15/2017   Procedure: HYSTERECTOMY ABDOMINAL WITH SALPINGECTOMY;  Surgeon: Lavonia Drafts, MD;  Location: [facility];  Service: Gynecology;  Laterality: Bilateral;  . lymph node removal     neck benign    Family History  Problem Relation Age of Onset  . Breast cancer Mother   . Heart disease Father   . Breast cancer Maternal Aunt   . Cancer Maternal Grandfather   . Breast cancer Maternal Grandmother     Social History   Social History Narrative  . Not on file     Review of Systems General: Denies fevers, chills, weight loss CV: Denies chest pain, shortness of breath, palpitations  Physical Exam Vitals with BMI 01/25/2020 07/19/2018 01/25/2018  Height 5\' 9"  5\' 9"  5\' 9"   Weight 145 lbs 3 oz 141 lbs 153 lbs  BMI 21.43 [id number]  Systolic 0000000 123XX123 [id number]  Diastolic 78 67 71  Pulse 90 85 77  General:  No acute distress,  Alert and oriented, Non-Toxic, Normal speech and affect Forehead: She has a 1 cm erythematous ulcerative lesion in the right upper forehead.  There is some blanching to the skin around it that she attributes to her imiquimod cream that she got from another provider. Left shoulder: On the superior aspect of her left shoulder over the trapezius she has a 1.5 cm erythematous ulcerative lesion with defined borders.  Assessment/Plan Patient presents with 2 concerning skin lesions one on the right forehead and one of the left shoulder.  These have not been biopsied but are clinically suspicious for basal cell carcinoma.  I  offered biopsy to her and excision.  She wants to go straight to excision and treat these as if there are skin cancers to avoid having multiple procedures done.  I discussed the risks include bleeding, infection, demonstrating structures, need for additional procedures.  I discussed the potential to have the lesions recur although that chance is low.  We will plan to set this up for these to be excised under local.  Cindra Presume 01/25/2020, 5:37 PM

## 2020-01-27 DIAGNOSIS — M1711 Unilateral primary osteoarthritis, right knee: Secondary | ICD-10-CM | POA: Diagnosis not present

## 2020-01-27 DIAGNOSIS — M25561 Pain in right knee: Secondary | ICD-10-CM | POA: Diagnosis not present

## 2020-01-30 DIAGNOSIS — F4322 Adjustment disorder with anxiety: Secondary | ICD-10-CM | POA: Diagnosis not present

## 2020-01-30 DIAGNOSIS — F900 Attention-deficit hyperactivity disorder, predominantly inattentive type: Secondary | ICD-10-CM | POA: Diagnosis not present

## 2020-01-31 DIAGNOSIS — F4322 Adjustment disorder with anxiety: Secondary | ICD-10-CM | POA: Diagnosis not present

## 2020-01-31 DIAGNOSIS — F900 Attention-deficit hyperactivity disorder, predominantly inattentive type: Secondary | ICD-10-CM | POA: Diagnosis not present

## 2020-02-27 ENCOUNTER — Ambulatory Visit (INDEPENDENT_AMBULATORY_CARE_PROVIDER_SITE_OTHER): Payer: 59 | Admitting: Plastic Surgery

## 2020-02-27 ENCOUNTER — Other Ambulatory Visit (HOSPITAL_COMMUNITY)
Admission: RE | Admit: 2020-02-27 | Discharge: 2020-02-27 | Disposition: A | Discharge status: Home / Self Care | Payer: 59 | Source: Ambulatory Visit | Attending: Plastic Surgery | Admitting: Plastic Surgery

## 2020-02-27 ENCOUNTER — Encounter: Payer: Self-pay | Admitting: Plastic Surgery

## 2020-02-27 ENCOUNTER — Other Ambulatory Visit: Payer: Self-pay

## 2020-02-27 VITALS — BP 119/79 | HR 83 | Temp 98.6°F | Ht 69.0 in | Wt 146.6 lb

## 2020-02-27 DIAGNOSIS — C44319 Basal cell carcinoma of skin of other parts of face: Secondary | ICD-10-CM | POA: Insufficient documentation

## 2020-02-27 DIAGNOSIS — C44619 Basal cell carcinoma of skin of left upper limb, including shoulder: Secondary | ICD-10-CM | POA: Diagnosis not present

## 2020-02-27 DIAGNOSIS — C44612 Basal cell carcinoma of skin of right upper limb, including shoulder: Secondary | ICD-10-CM | POA: Diagnosis not present

## 2020-02-27 NOTE — Progress Notes (Signed)
Operative Note   DATE OF OPERATION: 02/27/2020  LOCATION:    SURGICAL DEPARTMENT: Plastic Surgery  PREOPERATIVE DIAGNOSES:  Suspicious lesions in areas of previously diagnosed basal cell carcinomas right forehead and left shoulder  POSTOPERATIVE DIAGNOSES:  same  PROCEDURE:  1. Excision of right forehead lesion measuring 4 cm 2. Complex closure right forehead measuring 4 cm 3. Excision left shoulder lesion measuring 6 cm 4. Complex closure left shoulder measuring 6 cm  SURGEON: Talmadge Coventry, MD  ANESTHESIA:  Local  COMPLICATIONS: None.   INDICATIONS FOR PROCEDURE:  The patient, Caitlin Cain is a 44 y.o. female born on 11/22/1975, is here for treatment of suspicious lesions in areas of previously diagnosed basal cell carcinoma. MRN: [id number]  CONSENT:  Informed consent was obtained directly from the patient. Risks, benefits and alternatives were fully discussed. Specific risks including but not limited to bleeding, infection, hematoma, seroma, scarring, pain, infection, wound healing problems, and need for further surgery were all discussed. The patient did have an ample opportunity to have questions answered to satisfaction.   DESCRIPTION OF PROCEDURE:  Local anesthesia was administered. The patient's operative site was prepped and draped in a sterile fashion. A time out was performed and all information was confirmed to be correct.  The lesions were excised with a 15 blade.  Hemostasis was obtained.  Circumferential undermining was performed and the skin was advanced and closed in layers with interrupted buried Monocryl sutures and 5-0 fast gut for the skin on the forehead and 4-0 Monocryl for the skin on the shoulder.    The patient tolerated the procedure well.  There were no complications.

## 2020-02-29 ENCOUNTER — Ambulatory Visit: Payer: 59 | Admitting: Plastic Surgery

## 2020-02-29 LAB — SURGICAL PATHOLOGY

## 2020-03-01 MED FILL — hydrOXYzine HCL 25 MG TABS: 25 | 22 days supply | Qty: 90 | Fill #0

## 2020-03-01 MED FILL — AMPHETAMINE-DEXTROAMPHETAMI: 20 | 30 days supply | Qty: 60 | Fill #0

## 2020-03-14 ENCOUNTER — Other Ambulatory Visit: Payer: Self-pay

## 2020-03-14 ENCOUNTER — Ambulatory Visit (INDEPENDENT_AMBULATORY_CARE_PROVIDER_SITE_OTHER): Payer: 59 | Admitting: Plastic Surgery

## 2020-03-14 DIAGNOSIS — C44319 Basal cell carcinoma of skin of other parts of face: Secondary | ICD-10-CM

## 2020-03-14 NOTE — Progress Notes (Signed)
Patient is postop from excision of lesions on her right forehead and left shoulder.  These were both basal cell carcinomas with clear margins.  She is healing fine.  Some residual sutures were excised but both wounds look to be doing well.  All her questions were answered and she will follow up on an as-needed basis.

## 2020-03-27 DIAGNOSIS — M9903 Segmental and somatic dysfunction of lumbar region: Secondary | ICD-10-CM | POA: Diagnosis not present

## 2020-03-27 DIAGNOSIS — M9902 Segmental and somatic dysfunction of thoracic region: Secondary | ICD-10-CM | POA: Diagnosis not present

## 2020-03-27 DIAGNOSIS — M4126 Other idiopathic scoliosis, lumbar region: Secondary | ICD-10-CM | POA: Diagnosis not present

## 2020-03-27 DIAGNOSIS — M9901 Segmental and somatic dysfunction of cervical region: Secondary | ICD-10-CM | POA: Diagnosis not present

## 2020-03-27 DIAGNOSIS — M4124 Other idiopathic scoliosis, thoracic region: Secondary | ICD-10-CM | POA: Diagnosis not present

## 2020-03-29 DIAGNOSIS — F900 Attention-deficit hyperactivity disorder, predominantly inattentive type: Secondary | ICD-10-CM | POA: Diagnosis not present

## 2020-03-29 DIAGNOSIS — F4322 Adjustment disorder with anxiety: Secondary | ICD-10-CM | POA: Diagnosis not present

## 2020-03-29 MED FILL — AMPHETAMINE-DEXTROAMPHETAMI: 20 | 30 days supply | Qty: 60 | Fill #0

## 2020-04-24 MED FILL — AMPHETAMINE-DEXTROAMPHETAMI: 20 | 30 days supply | Qty: 60 | Fill #0

## 2020-04-24 MED FILL — hydrOXYzine HCL 25 MG TABS: 25 | 22 days supply | Qty: 90 | Fill #0

## 2020-05-22 MED FILL — HYDROXYZINE HCL 25 MG TABS: 25 | 23 days supply | Qty: 90 | Fill #1

## 2020-05-22 MED FILL — AMPHETAMINE-DEXTROAMPHETAMI: 20 | 21 days supply | Qty: 42 | Fill #0

## 2020-05-23 DIAGNOSIS — F4322 Adjustment disorder with anxiety: Secondary | ICD-10-CM | POA: Diagnosis not present

## 2020-05-23 DIAGNOSIS — F900 Attention-deficit hyperactivity disorder, predominantly inattentive type: Secondary | ICD-10-CM | POA: Diagnosis not present

## 2020-06-12 MED FILL — AMPHETAMINE-DEXTROAMPHETAMI: 20 | 30 days supply | Qty: 60 | Fill #0

## 2020-06-12 MED FILL — HYDROXYZINE HCL 25 MG TABS: 25 | 22 days supply | Qty: 90 | Fill #1

## 2020-07-10 ENCOUNTER — Other Ambulatory Visit (HOSPITAL_COMMUNITY): Payer: Self-pay

## 2020-07-10 DIAGNOSIS — F900 Attention-deficit hyperactivity disorder, predominantly inattentive type: Secondary | ICD-10-CM | POA: Diagnosis not present

## 2020-07-10 DIAGNOSIS — F4322 Adjustment disorder with anxiety: Secondary | ICD-10-CM | POA: Diagnosis not present

## 2020-07-10 MED FILL — HYDROXYZINE HCL 25 MG TABS: 25 | 22 days supply | Qty: 90 | Fill #0

## 2020-07-10 MED FILL — AMPHETAMINE-DEXTROAMPHETAMI: 20 | 30 days supply | Qty: 60 | Fill #0

## 2020-08-08 MED FILL — AMPHETAMINE-DEXTROAMPHETAMI: 20 | 30 days supply | Qty: 60 | Fill #0

## 2020-08-08 MED FILL — HYDROXYZINE HCL 25 MG TABS: 25 | 22 days supply | Qty: 90 | Fill #1

## 2020-08-16 ENCOUNTER — Other Ambulatory Visit (HOSPITAL_COMMUNITY): Payer: Self-pay

## 2020-08-16 DIAGNOSIS — F900 Attention-deficit hyperactivity disorder, predominantly inattentive type: Secondary | ICD-10-CM | POA: Diagnosis not present

## 2020-08-16 DIAGNOSIS — F4322 Adjustment disorder with anxiety: Secondary | ICD-10-CM | POA: Diagnosis not present

## 2020-08-16 MED FILL — LORazepam 0.5 MG TABS: 0.5 | 30 days supply | Qty: 30 | Fill #0

## 2020-08-28 MED FILL — LORazepam 0.5 MG TABS: 0.5 | 30 days supply | Qty: 30 | Fill #0

## 2020-08-29 ENCOUNTER — Other Ambulatory Visit: Payer: Self-pay | Admitting: Family

## 2020-08-29 ENCOUNTER — Telehealth: Payer: 59 | Admitting: Family

## 2020-08-29 DIAGNOSIS — J069 Acute upper respiratory infection, unspecified: Secondary | ICD-10-CM | POA: Diagnosis not present

## 2020-08-29 MED ORDER — FLUTICASONE PROPIONATE 50 MCG/ACT NA SUSP: 2.0000 | Freq: Every day | NASAL | 6 refills | Status: DC

## 2020-08-29 MED FILL — FLUTICASONE PROP 50 MCG SPR: 50 | 30 days supply | Qty: 16 | Fill #0

## 2020-08-29 NOTE — Progress Notes (Signed)

## 2020-09-03 ENCOUNTER — Other Ambulatory Visit: Payer: Self-pay | Admitting: Physician Assistant

## 2020-09-03 MED ORDER — AMOXICILLIN-POT CLAVULANATE 875-125 MG PO TABS: 1.0000 | ORAL_TABLET | Freq: Two times a day (BID) | ORAL | 0 refills | Status: DC

## 2020-09-03 MED FILL — AMOX-CLAV 875-125 MG TABLET: 875-125 | 7 days supply | Qty: 14 | Fill #0

## 2020-09-03 NOTE — Addendum Note (Signed)
Addended by: Abigail Butts on: 09/03/2020 09:49 AM   Modules accepted: Orders

## 2020-09-03 NOTE — Progress Notes (Signed)
Pt now requesting antibiotic.  Augmentin sent to pharmacy.

## 2020-09-05 MED FILL — AMPHETAMINE-DEXTROAMPHETAMI: 20 | 30 days supply | Qty: 60 | Fill #0

## 2020-09-05 MED FILL — HYDROXYZINE HCL 25 MG TABS: 25 | 22 days supply | Qty: 90 | Fill #0

## 2020-09-11 ENCOUNTER — Telehealth: Payer: 59 | Admitting: Emergency Medicine

## 2020-09-11 DIAGNOSIS — Z9189 Other specified personal risk factors, not elsewhere classified: Secondary | ICD-10-CM

## 2020-09-11 NOTE — Progress Notes (Signed)
E-Visit for Corona Virus Screening    It's a good question.  The situation you describe is a 2nd degree exposure.  A 2nd degree exposure is low risk if wearing a mask, but since it's unlikely that you have been masked constantly around your child, it would be appropriate to quarantine until your daughter is tested in a few days.  If both of you remain asymptomatic after 5 days of your daughter's exposure and your daughter's test is negative, you would be ok to stop the quarantine.  I'll send a note if needed to myChart.  Below is some additional information regarding testing and COVID-19.  Thanks for using e-visits!    Many health care providers can now test patients at their office but not all are.  [facility] has multiple testing sites. For information on our COVID testing locations and hours go to [email or web address]   Testing Information: The COVID-19 Community Testing sites are testing BY APPOINTMENT ONLY.  You can schedule online at [email or web address]  If you do not have access to a smart phone or computer you may call [phone] for an appointment.   Additional testing sites in the Community:  . For [facility] Testing sites in [facility]  FaceUpdate.[email or web address]  . For Pop-up testing sites in [place]  BowlDirectory.co.uk  . For [facility] [email or web address]  . For [facility] testing in [employer] [place] [employer] [email or web address]  . For [facility] testing in [facility]   [email or web address]  For  more information about community testing call [phone]   Please quarantine yourself while awaiting your test results. Please stay home  for a minimum of 10 days from the first day of illness with improving symptoms and you have had 24 hours of no fever (without the use of Tylenol (Acetaminophen) Motrin (Ibuprofen) or any fever reducing medication).  Also - Do not get tested prior to returning to work because once you have had a positive test the test can stay positive for more than a month in some cases.   You should wear a mask or cloth face covering over your nose and mouth if you must be around other people or animals, including pets (even at home). Try to stay at least 6 feet away from other people. This will protect the people around you.  Please continue good preventive care measures, including:  frequent hand-washing, avoid touching your face, cover coughs/sneezes, stay out of crowds and keep a 6 foot distance from others.  COVID-19 is a respiratory illness with symptoms that are similar to the flu. Symptoms are typically mild to moderate, but there have been cases of severe illness and death due to the virus.   The following symptoms may appear 2-14 days after exposure: . Fever . Cough . Shortness of breath or difficulty breathing . Chills . Repeated shaking with chills . Muscle pain . Headache . Sore throat . New loss of taste or smell . Fatigue . Congestion or runny nose . Nausea or vomiting . Diarrhea  Go to the nearest hospital ED for assessment if fever/cough/breathlessness are severe or illness seems like a threat to life.  It is vitally important that if you feel that you have an infection such as this virus or any other virus that you stay home and away from places where you may spread it to others.  You should avoid contact with people age 49 and older.    You may also take acetaminophen (Tylenol) as needed for fever.  Reduce  your risk of any infection by using the same precautions used for avoiding the common cold or flu:  Marland Kitchen Wash your hands often with soap and warm water for at least 20 seconds.  If soap  and water are not readily available, use an alcohol-based hand sanitizer with at least 60% alcohol.  . If coughing or sneezing, cover your mouth and nose by coughing or sneezing into the elbow areas of your shirt or coat, into a tissue or into your sleeve (not your hands). . Avoid shaking hands with others and consider head nods or verbal greetings only. . Avoid touching your eyes, nose, or mouth with unwashed hands.  . Avoid close contact with people who are sick. . Avoid places or events with large numbers of people in one location, like concerts or sporting events. . Carefully consider travel plans you have or are making. . If you are planning any travel outside or inside the [place], visit the CDC's Travelers' Health webpage for the latest health notices. . If you have some symptoms but not all symptoms, continue to monitor at home and seek medical attention if your symptoms worsen. . If you are having a medical emergency, call 911.  HOME CARE . Only take medications as instructed by your medical team. . Drink plenty of fluids and get plenty of rest. . A steam or ultrasonic humidifier can help if you have congestion.   GET HELP RIGHT AWAY IF YOU HAVE EMERGENCY WARNING SIGNS** FOR COVID-19. If you or someone is showing any of these signs seek emergency medical care immediately. Call 911 or proceed to your closest emergency facility if: . You develop worsening high fever. . Trouble breathing . Bluish lips or face . Persistent pain or pressure in the chest . New confusion . Inability to wake or stay awake . You cough up blood. . Your symptoms become more severe  **This list is not all possible symptoms. Contact your medical provider for any symptoms that are sever or concerning to you.  MAKE SURE YOU   Understand these instructions.  Will watch your condition.  Will get help right away if you are not doing well or get worse.  Your e-visit answers were reviewed by a board certified  advanced clinical practitioner to complete your personal care plan.  Depending on the condition, your plan could have included both over the counter or prescription medications.  If there is a problem please reply once you have received a response from your provider.  Your safety is important to [place].  If you have drug allergies check your prescription carefully.    You can use MyChart to ask questions about today's visit, request a non-urgent call back, or ask for a work or school excuse for 24 hours related to this e-Visit. If it has been greater than 24 hours you will need to follow up with your provider, or enter a new e-Visit to address those concerns. You will get an e-mail in the next two days asking about your experience.  I hope that your e-visit has been valuable and will speed your recovery. Thank you for using e-visits.  Greater than 5 minutes, yet less than 10 minutes was used in reviewing the patient's chart, questionnaire, prescribing medications, and documentation for this visit.

## 2020-09-27 MED FILL — AMPHETAMINE-DEXTROAMPHETAMI: 20 | 30 days supply | Qty: 60 | Fill #0

## 2020-10-11 MED FILL — HYDROXYZINE HCL 25 MG TABS: 25 | 22 days supply | Qty: 90 | Fill #1

## 2020-10-15 ENCOUNTER — Other Ambulatory Visit (HOSPITAL_COMMUNITY): Payer: Self-pay

## 2020-10-24 MED FILL — AMPHETAMINE-DEXTROAMPHETAMI: 20 | 30 days supply | Qty: 60 | Fill #0

## 2020-11-15 MED FILL — HYDROXYZINE HCL 25 MG TABS: 25 | 30 days supply | Qty: 90 | Fill #0

## 2020-11-21 MED FILL — AMPHETAMINE-DEXTROAMPHETAMI: 20 | 30 days supply | Qty: 60 | Fill #0

## 2020-12-12 ENCOUNTER — Other Ambulatory Visit (HOSPITAL_COMMUNITY): Payer: Self-pay

## 2020-12-12 MED FILL — HYDROXYZINE HCL 25 MG TABS: 25 | 30 days supply | Qty: 90 | Fill #0

## 2020-12-17 ENCOUNTER — Other Ambulatory Visit (HOSPITAL_COMMUNITY): Payer: Self-pay

## 2021-01-07 ENCOUNTER — Telehealth: Payer: Self-pay | Admitting: Physician Assistant

## 2021-01-07 DIAGNOSIS — A084 Viral intestinal infection, unspecified: Secondary | ICD-10-CM

## 2021-01-07 NOTE — Progress Notes (Signed)
We are sorry that you are not feeling well.  Here is how we plan to help!  Based on what you have shared with me it looks like you have Acute Infectious Diarrhea.  Most cases of acute diarrhea are due to infections with virus and bacteria and are self-limited conditions lasting less than 14 days.  For your symptoms you may take Imodium 2 mg tablets that are over the counter at your local pharmacy. Take two tablet now and then one after each loose stool up to 6 a day.  Antibiotics are not needed for most people with diarrhea.   HOME CARE  We recommend changing your diet to help with your symptoms for the next few days.  Drink plenty of fluids that contain water salt and sugar. Sports drinks such as Gatorade may help.   You may try broths, soups, bananas, applesauce, soft breads, mashed potatoes or crackers.   You are considered infectious for as long as the diarrhea continues. Hand washing or use of alcohol based hand sanitizers is recommend.  It is best to stay out of work or school until your symptoms stop.   GET HELP RIGHT AWAY  If you have dark yellow colored urine or do not pass urine frequently you should drink more fluids.    If your symptoms worsen   If you feel like you are going to pass out (faint)  You have a new problem  MAKE SURE YOU   Understand these instructions.  Will watch your condition.  Will get help right away if you are not doing well or get worse.  Your e-visit answers were reviewed by a board certified advanced clinical practitioner to complete your personal care plan.  Depending on the condition, your plan could have included both over the counter or prescription medications.  If there is a problem please reply  once you have received a response from your provider.  Your safety is important to us.  If you have drug allergies check your prescription carefully.    You can use MyChart to ask questions about today's visit, request a non-urgent call  back, or ask for a work or school excuse for 24 hours related to this e-Visit. If it has been greater than 24 hours you will need to follow up with your provider, or enter a new e-Visit to address those concerns.   You will get an e-mail in the next two days asking about your experience.  I hope that your e-visit has been valuable and will speed your recovery. Thank you for using e-visits.  

## 2021-01-07 NOTE — Progress Notes (Signed)
I have spent 5 minutes in review of e-visit questionnaire, review and updating patient chart, medical decision making and response to patient.   U[place] Acree Cody Donatella Walski, [name]    

## 2021-01-07 NOTE — Progress Notes (Deleted)
Person trying to do a video visit for their child through their own mychart. Discussed cannot do this and we do not treat pediatric patients via e-visit. Erroneous encounter.

## 2021-01-11 ENCOUNTER — Ambulatory Visit (HOSPITAL_BASED_OUTPATIENT_CLINIC_OR_DEPARTMENT_OTHER): Payer: Self-pay | Admitting: Nurse Practitioner

## 2021-01-11 NOTE — Patient Instructions (Incomplete)
Recommendations from today's visit:      __________________________________________________________________________________________  Thank you for choosing [place] at [facility] for your Primary Care needs. I am excited for the opportunity to partner with you to meet your health care goals. It was a pleasure meeting you today!  I am an Adult-Geriatric Nurse Practitioner with a background in caring for patients for more than 20 years. I received my Paediatric nurse in Nursing and my Doctor of Nursing Practice degrees at [employer]. I received additional fellowship training in primary care and sports medicine after receiving my doctorate degree. I provide primary care and sports medicine services to patients age 45 and older within this office. I am also a provider with the [facility] and the director of the APP Fellowship with [facility].  I am a [place] native, but have called the [place] area home for nearly 20 years and am proud to be a member of this community.   I am passionate about providing the best service to you through preventive medicine and supportive care. I consider you a part of the medical team and value your input. I work diligently to ensure that you are heard and your needs are met in a safe and effective manner. I want you to feel comfortable with me as your provider and want you to know that your health concerns are important to me.   For your information, our office hours are Monday- Friday 8:00 AM - 5:00 PM At this time I am not in the office on Wednesdays.  If you have questions or concerns, please call our office at [phone] or send [place] a MyChart message and we will respond as quickly as possible.   For all urgent or time sensitive needs we ask that you please call the office to avoid delays. MyChart is not constantly monitored and replies may take up to 72 business hours.  MyChart  Policy: . MyChart allows for you to see your visit notes, after visit summary, provider recommendations, lab and tests results, make an appointment, request refills, and contact your provider or the office for non-urgent questions or concerns.  . Providers are seeing patients during normal business hours and do not have built in time to review MyChart messages. We ask that you allow a minimum of 72 business hours for MyChart message responses.  . Complex MyChart concerns may require a visit. Your provider may request you schedule a virtual or in person visit to ensure we are providing the best care possible. . MyChart messages sent after 4:00 PM on Friday will not be received by the provider until Monday morning.    Lab and Test Results: . You will receive your lab and test results on MyChart as soon as they are completed and results have been sent by the lab or testing facility. Due to this service, you will receive your results BEFORE your provider.  . Please allow a minimum of 72 business hours for your provider to receive and review lab and test results and contact you about.   . Most lab and test result comments from the provider will be sent through [place]. Your provider may recommend changes to the plan of care, follow-up visits, repeat testing, ask questions, or request an office visit to discuss these results. You may reply directly to this message or call the office at [phone] to provide information for the provider or set up an appointment. . In some instances,  you will be called with test results and recommendations. Please let us know if this is preferred and we will make note of this in your chart to provide this for you.    . If you have not heard a response to your lab or test results in 72 business hours, please call the office to let us know.   After Hours: . For all non-emergency after hours needs, please call the office at [phone] and select the option to reach the  on-call provider service. On-call services are shared between multiple [place] offices and therefore it will not be possible to speak directly with your provider. On-call providers may provide medical advice and recommendations, but are unable to provide refills for maintenance medications.  . For all emergency or urgent medical needs after normal business hours, we recommend that you seek care at the closest Urgent Care or Emergency Department to ensure appropriate treatment in a timely manner.  [facility] [place] [facility] [place] [facility] has a 24 hour emergency room located on the ground floor for your convenience.    Please do not hesitate to reach out to [place] with concerns.   Thank you, again, for choosing me as your health care partner. I appreciate your trust and look forward to learning more about you.   SaraBeth Early, DNP, AGNP-c ______________________________________________________________________________________________  I recommend that all adults get at least 20 minutes of moderate physical activity that elevates your heart rate at least 5 days out of the week.  Some examples include: . Walking or jogging at a pace that allows you to carry on a conversation . Cycling (stationary bike or outdoors) . Water aerobics . Yoga . Weight lifting . Dancing If physical limitations prevent you from putting stress on your joints, exercise in a pool or seated in a chair are excellent options.  Do determine your MAXIMUM heart rate for activity: YOUR AGE - 220 = MAX HeartRate   Remember! . Do not push yourself too hard.  . Start slowly and build up your pace, speed, weight, time in exercise, etc.  . Allow your body to rest between exercise and get good sleep. . You will need more water than normal when you are exerting yourself. Do not wait until you are thirsty to drink. Drink with a purpose of getting in at least 8, 8 ounce glasses of water a day plus more depending on how much you exercise  and sweat.    If you begin to develop dizziness, chest pain, abdominal pain, jaw pain, shortness of breath, headache, vision changes, lightheadedness, or other concerning symptoms, stop the activity and allow your body to rest. If your symptoms are severe, seek emergency evaluation immediately. If your symptoms are concerning, but not severe, please let us know so that we can recommend further evaluation.   I recommend that all patients maintain a diet low in saturated fats, carbohydrates, and cholesterol. While this can be challenging at first, it is not impossible and small changes can make big differences.  Things to try: Marland Kitchen Decreasing the amount of soda, sweet tea, and/or juice to one or less per day and replace with water o While water is always the first choice, if you do not like water you may consider - adding a water additive without sugar to improve the taste - other sugar free drinks . Replace potatoes with a brightly colored vegetable at dinner . Use healthy oils, such as canola oil or olive oil, instead of butter or hard  margarine . Limit your bread intake to two pieces or less a day . Replace regular pasta with low carb pasta options . Bake, broil, or grill foods instead of frying . Monitor portion sizes  . Eat smaller, more frequent meals throughout the day instead of large meals  An important thing to remember is, if you love foods that are not great for your health, you don't have to give them up completely. Instead, allow these foods to be a reward when you have done well. Allowing yourself to still have special treats every once in a while is a nice way to tell yourself thank you for working hard to keep yourself healthy.   Also remember that every day is a new day. If you have a bad day and "fall off the wagon", you can still climb right back up and keep moving along on your journey!  We have resources available to help you!  Some websites that may be helpful  include: . www.http://carter.biz/  . Www.VeryWellFit.com ______________________________________________________________________________________________  Health Maintenance Recommendations Screening Testing  Mammogram  Every 1 -2 years based on history and risk factors  Starting at age 45  Pap Smear  Ages 21-39 every 3 years  Ages 45-65 every 5 years with HPV testing  More frequent testing may be required based on results and history  Colon Cancer Screening  Every 1-10 years based on test performed, risk factors, and history  Starting at age 73  Bone Density Screening  Every 2-10 years based on history  Starting at age 66 for women  Recommendations for men differ based on medication usage, history, and risk factors  AAA Screening  One time ultrasound  Men 76-25 years old who have every smoked  Lung Cancer Screening  Low Dose Lung CT every 12 months  Age 14-80 years with a 30 pack-year smoking history who still smoke or who have quit within the last 15 years  Screening Labs  Routine  Labs: Complete Blood Count (CBC), Complete Metabolic Panel (CMP), Cholesterol (Lipid Panel)  Every 6-12 months based on history and medications  May be recommended more frequently based on current conditions or previous results  Hemoglobin A1c Lab  Every 3-12 months based on history and previous results  Starting at age 61 or earlier with diagnosis of diabetes, high cholesterol, BMI >26, and/or risk factors  Frequent monitoring for patients with diabetes to ensure blood sugar control  Thyroid Panel (TSH w/ T3 & T4)  Every 6 months based on history, symptoms, and risk factors  May be repeated more often if on medication  HIV  One time testing for all patients 56 and older  May be repeated more frequently for patients with increased risk factors or exposure  Hepatitis C  One time testing for all patients 83 and older  May be repeated more frequently for patients with  increased risk factors or exposure  Gonorrhea, Chlamydia  Every 12 months for all sexually active persons 13-24 years  Additional monitoring may be recommended for those who are considered high risk or who have symptoms  PSA  Men 88-42 years old with risk factors  Additional screening may be recommended from age 64-69 based on risk factors, symptoms, and history  Vaccine Recommendations  Tetanus Booster  All adults every 10 years  Flu Vaccine  All patients 6 months and older every year  COVID Vaccine  All patients 12 years and older  Initial dosing with booster  May recommend additional booster based on  age and health history  HPV Vaccine  2 doses all patients age 84-26  Dosing may be considered for patients over 26  Shingles Vaccine (Shingrix)  2 doses all adults 100 years and older  Pneumonia (Pneumovax 59)  All adults 70 years and older  May recommend earlier dosing based on health history  Pneumonia (Prevnar 62)  All adults 80 years and older  Dosed 1 year after Pneumovax 23  Additional Screening, Testing, and Vaccinations may be recommended on an individualized basis based on family history, health history, risk factors, and/or exposure.

## 2021-01-15 ENCOUNTER — Other Ambulatory Visit (HOSPITAL_COMMUNITY): Payer: Self-pay

## 2021-01-15 MED FILL — Amphetamine-Dextroamphetamine Tab 20 MG: ORAL | 30 days supply | Qty: 60 | Fill #0 | Status: AC

## 2021-01-15 MED FILL — Hydroxyzine HCl Tab 25 MG: ORAL | 22 days supply | Qty: 90 | Fill #0 | Status: AC

## 2021-01-16 ENCOUNTER — Encounter (HOSPITAL_BASED_OUTPATIENT_CLINIC_OR_DEPARTMENT_OTHER): Payer: Self-pay | Admitting: Nurse Practitioner

## 2021-01-23 NOTE — Progress Notes (Deleted)
    Subjective:    CC: R knee pain  I, Caitlin Cain, LAT, ATC, am serving as scribe for Dr. Lynne Leader.  HPI: Pt is a 45 y/o female presenting w/ c/o R knee pain since .  She locates her pain to .  Radiating pain: R knee swelling: R knee mechanical symptoms: Aggravating factors: Treatments tried:  Pertinent review of Systems: ***  Relevant historical information: ***   Objective:   There were no vitals filed for this visit. General: Well Developed, well nourished, and in no acute distress.   MSK: ***  Lab and Radiology Results No results found for this or any previous visit (from the past 72 hour(s)). No results found.    Impression and Recommendations:    Assessment and Plan: 45 y.o. female with ***.  PDMP not reviewed this encounter. No orders of the defined types were placed in this encounter.  No orders of the defined types were placed in this encounter.   Discussed warning signs or symptoms. Please see discharge instructions. Patient expresses understanding.   ***

## 2021-01-24 ENCOUNTER — Ambulatory Visit: Payer: Self-pay | Admitting: Family Medicine

## 2021-01-29 ENCOUNTER — Telehealth (HOSPITAL_BASED_OUTPATIENT_CLINIC_OR_DEPARTMENT_OTHER): Payer: Self-pay

## 2021-01-29 NOTE — Telephone Encounter (Signed)
LVMTCB regarding NP appt on 4/28 seeing if we can move her appt to 10:10 instead of 9:50. Please advise.

## 2021-01-30 NOTE — Telephone Encounter (Signed)
Pt called back stated that it was okay to come in later.

## 2021-01-31 ENCOUNTER — Other Ambulatory Visit: Payer: Self-pay

## 2021-01-31 ENCOUNTER — Encounter (HOSPITAL_BASED_OUTPATIENT_CLINIC_OR_DEPARTMENT_OTHER): Payer: Self-pay | Admitting: Nurse Practitioner

## 2021-01-31 ENCOUNTER — Ambulatory Visit (INDEPENDENT_AMBULATORY_CARE_PROVIDER_SITE_OTHER): Payer: No Typology Code available for payment source | Admitting: Nurse Practitioner

## 2021-01-31 ENCOUNTER — Other Ambulatory Visit (HOSPITAL_COMMUNITY): Payer: Self-pay

## 2021-01-31 VITALS — BP 102/61 | HR 75 | Ht 69.0 in | Wt 145.6 lb

## 2021-01-31 DIAGNOSIS — M25461 Effusion, right knee: Secondary | ICD-10-CM

## 2021-01-31 DIAGNOSIS — M25561 Pain in right knee: Secondary | ICD-10-CM

## 2021-01-31 DIAGNOSIS — Z803 Family history of malignant neoplasm of breast: Secondary | ICD-10-CM

## 2021-01-31 DIAGNOSIS — Z7689 Persons encountering health services in other specified circumstances: Secondary | ICD-10-CM | POA: Insufficient documentation

## 2021-01-31 DIAGNOSIS — Z1231 Encounter for screening mammogram for malignant neoplasm of breast: Secondary | ICD-10-CM | POA: Insufficient documentation

## 2021-01-31 DIAGNOSIS — T7431XD Adult psychological abuse, confirmed, subsequent encounter: Secondary | ICD-10-CM

## 2021-01-31 DIAGNOSIS — Z0000 Encounter for general adult medical examination without abnormal findings: Secondary | ICD-10-CM | POA: Insufficient documentation

## 2021-01-31 DIAGNOSIS — F411 Generalized anxiety disorder: Secondary | ICD-10-CM | POA: Diagnosis not present

## 2021-01-31 DIAGNOSIS — F900 Attention-deficit hyperactivity disorder, predominantly inattentive type: Secondary | ICD-10-CM

## 2021-01-31 DIAGNOSIS — F419 Anxiety disorder, unspecified: Secondary | ICD-10-CM | POA: Insufficient documentation

## 2021-01-31 DIAGNOSIS — J301 Allergic rhinitis due to pollen: Secondary | ICD-10-CM | POA: Diagnosis not present

## 2021-01-31 MED ORDER — LEVOCETIRIZINE DIHYDROCHLORIDE 5 MG PO TABS
5.0000 mg | ORAL_TABLET | Freq: Every evening | ORAL | 11 refills | Status: AC
  Filled 2021-01-31: qty 30, 30d supply, fill #0

## 2021-01-31 MED ORDER — BUDESONIDE 32 MCG/ACT NA SUSP
2.0000 | Freq: Every day | NASAL | 6 refills | Status: AC
  Filled 2021-01-31: qty 8.43, fill #0
  Filled 2021-02-05: qty 8.43, 30d supply, fill #0

## 2021-01-31 NOTE — Addendum Note (Signed)
Addended by: Johnn Krasowski, Clarise Cruz E on: 01/31/2021 01:26 PM   Modules accepted: Level of Service

## 2021-01-31 NOTE — Assessment & Plan Note (Signed)
Family history of breast cancer in her mother, grandmother, and maternal aunt.  Her last mammogram was in 2018 and was reportedly normal. Discussed with her recommendations for annual mammograms starting now especially with a strong family history. Will consider genetic testing if patient wishes. Order placed today for mammogram

## 2021-01-31 NOTE — Assessment & Plan Note (Addendum)
New patient to establish care today.  Review of current and past medical history, medications, allergies, surgeries, SDOH, and family history.  Will obtain and review previous medical records for determination of needs.  CPE performed today with labs ordered. She is due for mammogram order placed today. Referral for orthopedics placed today to [facility] due to existing relationship with Dr. Maxie Cain. We will plan to follow-up with patient as needed and in 1 year for annual physical exam.

## 2021-01-31 NOTE — Assessment & Plan Note (Signed)
Symptoms and presentation consistent with generalized anxiety disorder most likely exacerbated by her current social concerns with the father of her child and a very high stress job. Her symptoms appear to be well controlled with as needed Ativan and nighttime hydroxyzine. No plans to change medications today. Did discuss with patient if she wishes to transfer her care to a [facility] therapist that I would be happy to manage her medications for her if needed.  She will let us know if she wishes to do this.
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
## 2021-01-31 NOTE — Assessment & Plan Note (Signed)
Pain, swelling, and limited range of motion noted within the right knee.  The majority of the swelling appears to be along the medial distal border of the kneecap with tenderness noted on both the medial and lateral sides of the kneecap. She is currently wearing a knee brace which is offering good support and allowing her to ambulate independently. Referral placed today for [facility] for which the patient already has an existing relationship.  Recommend that she continue to use the knee brace and ice the knee when she has been on it or at least 20 minutes every day. Recommend ibuprofen or Aleve for pain and inflammation.

## 2021-01-31 NOTE — Assessment & Plan Note (Signed)
Breast cancer history in her mother, grandmother, and maternal aunt.  Order placed today for screening mammogram. Recommendations for annual mammograms discussed with patient.
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
## 2021-01-31 NOTE — Assessment & Plan Note (Signed)
>>  ASSESSMENT AND PLAN FOR GENERALIZED ANXIETY DISORDER WRITTEN ON 01/31/2021  1:23 PM BY Noralee Dutko E, NP  Symptoms and presentation consistent with generalized anxiety disorder most likely exacerbated by her current social concerns with the father of her child and a very high stress job. Her symptoms appear to be well controlled with as needed Ativan and nighttime hydroxyzine. No plans to change medications today. Did discuss with patient if she wishes to transfer her care to a [facility] therapist that I would be happy to manage her medications for her if needed.  She will let us know if she wishes to do this.

## 2021-01-31 NOTE — Assessment & Plan Note (Signed)
Symptoms and presentation consistent with seasonal allergies with evidence of middle ear effusion bilaterally and cobblestoning on the posterior oropharynx.  Discussed with her that she may begin an oral antihistamine treatment in addition to the nasal glucocorticoid treatment.  We will change the nasal spray today to Rhinocort to see if she gets better results from this as opposed to the [facility] which she has been using.  Prescription sent for Xyzal to be taken 1/2-1 tab at bedtime for allergy symptoms. Recommend that she monitor for worsening symptoms or signs of infection and contact the office if these begin to develop.

## 2021-01-31 NOTE — Assessment & Plan Note (Signed)
Longstanding history of attention deficit disorder currently taking Adderall 20 mg bid with good results.  She is not using this medication on a daily basis but only on days that she works. No concerns with side effects or worsening anxiety symptoms. This medication is currently managed by psychiatry however discussed with patient if she wishes to transfer care we will be happy to manage her medication and her symptoms.

## 2021-01-31 NOTE — Assessment & Plan Note (Signed)
CPE performed today laboratory tests ordered.  We will follow-up on lab results and make changes to the plan of care as necessary based on lab results.

## 2021-01-31 NOTE — Patient Instructions (Signed)
It was a pleasure to meet you today. Please let me know if you would like for me to take over the management of your other medications and send a referral to the counselor here and we can do that for you.   I have sent the referral for [facility] with Dr. Maxie Better.   I have sent the order for your mammogram and labs today. We will let you know if there are any abnormalities that need to be addressed.   I sent the medication in for oral Xyzal and the nasal spray for rhinocort. I think these will be good options to try. If the xyzal is too much, you can cut the dose in half or take it every other day.   Please let me know if you have any needs or concerns.   _____________________________________________ Thank you for choosing [facility] for your Primary Care needs. I am excited for the opportunity to partner with you to meet your health care goals. It was a pleasure meeting you today!  I am an Adult-Geriatric Nurse Practitioner with a background in caring for patients for more than 20 years. I received my Paediatric nurse in Nursing and my Doctor of Nursing Practice degrees at [employer]. I received additional fellowship training in primary care and sports medicine after receiving my doctorate degree. I provide primary care and sports medicine services to patients age 49 and older within this office. I am also a provider with the [facility] and the director of the APP Fellowship with [facility].  I am a [place] native, but have called the [place] area home for nearly 20 years and am proud to be a member of this community.   I am passionate about providing the best service to you through preventive medicine and supportive care. I consider you a part of the medical team and value your input. I work diligently to ensure that you are heard and your needs are met in a safe and effective manner. I want you to feel  comfortable with me as your provider and want you to know that your health concerns are important to me.   For your information, our office hours are Monday- Friday 8:00 AM - 5:00 PM At this time I am not in the office on Wednesdays.  If you have questions or concerns, please call our office at [phone] or send [place] a MyChart message and we will respond as quickly as possible.   For all urgent or time sensitive needs we ask that you please call the office to avoid delays. MyChart is not constantly monitored and replies may take up to 72 business hours.  MyChart Policy: . MyChart allows for you to see your visit notes, after visit summary, provider recommendations, lab and tests results, make an appointment, request refills, and contact your provider or the office for non-urgent questions or concerns.  . Providers are seeing patients during normal business hours and do not have built in time to review MyChart messages. We ask that you allow a minimum of 72 business hours for MyChart message responses.  . Complex MyChart concerns may require a visit. Your provider may request you schedule a virtual or in person visit to ensure we are providing the best care possible. . MyChart messages sent after 4:00 PM on Friday will not be received by the provider until Monday morning.    Lab and Test Results: . You will  receive your lab and test results on MyChart as soon as they are completed and results have been sent by the lab or testing facility. Due to this service, you will receive your results BEFORE your provider.  . Please allow a minimum of 72 business hours for your provider to receive and review lab and test results and contact you about.   . Most lab and test result comments from the provider will be sent through [place]. Your provider may recommend changes to the plan of care, follow-up visits, repeat testing, ask questions, or request an office visit to discuss these results. You may reply  directly to this message or call the office at [phone] to provide information for the provider or set up an appointment. . In some instances, you will be called with test results and recommendations. Please let us know if this is preferred and we will make note of this in your chart to provide this for you.    . If you have not heard a response to your lab or test results in 72 business hours, please call the office to let us know.   After Hours: . For all non-emergency after hours needs, please call the office at [phone] and select the option to reach the on-call provider service. On-call services are shared between multiple [facility] offices and therefore it will not be possible to speak directly with your provider. On-call providers may provide medical advice and recommendations, but are unable to provide refills for maintenance medications.  . For all emergency or urgent medical needs after normal business hours, we recommend that you seek care at the closest Urgent Care or Emergency Department to ensure appropriate treatment in a timely manner.  [facility] [place] [facility] has a 24 hour emergency room located on the ground floor for your convenience.    Please do not hesitate to reach out to [place] with concerns.   Thank you, again, for choosing me as your health care partner. I appreciate your trust and look forward to learning more about you.   Worthy Keeler, DNP, AGNP-c ________________________________________________________

## 2021-02-05 ENCOUNTER — Other Ambulatory Visit (HOSPITAL_COMMUNITY): Payer: Self-pay

## 2021-02-06 ENCOUNTER — Encounter (HOSPITAL_BASED_OUTPATIENT_CLINIC_OR_DEPARTMENT_OTHER): Payer: Self-pay | Admitting: Nurse Practitioner

## 2021-02-06 ENCOUNTER — Other Ambulatory Visit (HOSPITAL_COMMUNITY): Payer: Self-pay

## 2021-02-07 ENCOUNTER — Other Ambulatory Visit (HOSPITAL_BASED_OUTPATIENT_CLINIC_OR_DEPARTMENT_OTHER): Payer: Self-pay | Admitting: Nurse Practitioner

## 2021-02-07 ENCOUNTER — Other Ambulatory Visit (HOSPITAL_COMMUNITY): Payer: Self-pay

## 2021-02-07 MED ORDER — MOLNUPIRAVIR 200 MG PO CAPS
800.0000 mg | ORAL_CAPSULE | Freq: Every day | ORAL | 0 refills | Status: AC
  Filled 2021-02-07: qty 40, 5d supply, fill #0
  Filled 2021-02-07: qty 20, 5d supply, fill #0

## 2021-02-08 ENCOUNTER — Other Ambulatory Visit (HOSPITAL_COMMUNITY): Payer: Self-pay

## 2021-02-08 MED ORDER — AMPHETAMINE-DEXTROAMPHETAMINE 20 MG PO TABS
ORAL_TABLET | ORAL | 0 refills | Status: DC
  Filled 2021-02-08: qty 60, 30d supply, fill #0
  Filled 2021-02-08: qty 60, fill #0
  Filled 2021-02-13: qty 60, 30d supply, fill #0

## 2021-02-08 MED ORDER — AMPHETAMINE-DEXTROAMPHETAMINE 20 MG PO TABS
ORAL_TABLET | ORAL | 0 refills | Status: DC
  Filled 2021-03-13: qty 60, 30d supply, fill #0

## 2021-02-08 MED ORDER — HYDROXYZINE HCL 25 MG PO TABS
ORAL_TABLET | ORAL | 1 refills | Status: DC
  Filled 2021-02-08: qty 90, 22d supply, fill #0
  Filled 2021-03-13: qty 90, 22d supply, fill #1

## 2021-02-09 ENCOUNTER — Other Ambulatory Visit (HOSPITAL_COMMUNITY): Payer: Self-pay

## 2021-02-11 ENCOUNTER — Other Ambulatory Visit (HOSPITAL_BASED_OUTPATIENT_CLINIC_OR_DEPARTMENT_OTHER): Payer: Self-pay | Admitting: Nurse Practitioner

## 2021-02-11 DIAGNOSIS — H6690 Otitis media, unspecified, unspecified ear: Secondary | ICD-10-CM

## 2021-02-11 DIAGNOSIS — U071 COVID-19: Secondary | ICD-10-CM

## 2021-02-11 MED ORDER — ALBUTEROL SULFATE HFA 108 (90 BASE) MCG/ACT IN AERS: 1.0000 | INHALATION_SPRAY | RESPIRATORY_TRACT | 1 refills | Status: AC | PRN

## 2021-02-11 MED ORDER — BENZONATATE 200 MG PO CAPS: 200.0000 mg | ORAL_CAPSULE | Freq: Three times a day (TID) | ORAL | 0 refills | Status: DC | PRN

## 2021-02-11 MED ORDER — AMOXICILLIN-POT CLAVULANATE 875-125 MG PO TABS: 1.0000 | ORAL_TABLET | Freq: Two times a day (BID) | ORAL | 0 refills | Status: DC

## 2021-02-11 MED ORDER — FLUCONAZOLE 150 MG PO TABS: 150.0000 mg | ORAL_TABLET | Freq: Every day | ORAL | 1 refills | Status: DC

## 2021-02-13 ENCOUNTER — Other Ambulatory Visit (HOSPITAL_COMMUNITY): Payer: Self-pay

## 2021-02-18 ENCOUNTER — Other Ambulatory Visit (HOSPITAL_BASED_OUTPATIENT_CLINIC_OR_DEPARTMENT_OTHER): Payer: Self-pay | Admitting: Nurse Practitioner

## 2021-02-18 ENCOUNTER — Encounter (HOSPITAL_BASED_OUTPATIENT_CLINIC_OR_DEPARTMENT_OTHER): Payer: Self-pay | Admitting: Nurse Practitioner

## 2021-02-18 DIAGNOSIS — H65193 Other acute nonsuppurative otitis media, bilateral: Secondary | ICD-10-CM

## 2021-02-25 ENCOUNTER — Other Ambulatory Visit (HOSPITAL_COMMUNITY): Payer: Self-pay

## 2021-02-25 ENCOUNTER — Other Ambulatory Visit: Payer: Self-pay

## 2021-02-25 MED ORDER — OXYCODONE HCL 5 MG PO TABS
ORAL_TABLET | ORAL | 0 refills | Status: DC
  Filled 2021-02-25: qty 42, 7d supply, fill #0

## 2021-03-06 ENCOUNTER — Encounter (HOSPITAL_BASED_OUTPATIENT_CLINIC_OR_DEPARTMENT_OTHER): Payer: Self-pay | Admitting: Nurse Practitioner

## 2021-03-06 DIAGNOSIS — F419 Anxiety disorder, unspecified: Secondary | ICD-10-CM

## 2021-03-06 NOTE — Progress Notes (Signed)
Abstraction of medical information completed based on medical records review.

## 2021-03-08 ENCOUNTER — Encounter: Payer: Self-pay | Admitting: *Deleted

## 2021-03-13 ENCOUNTER — Other Ambulatory Visit (HOSPITAL_COMMUNITY): Payer: Self-pay

## 2021-03-20 ENCOUNTER — Other Ambulatory Visit: Payer: Self-pay

## 2021-03-20 ENCOUNTER — Ambulatory Visit (INDEPENDENT_AMBULATORY_CARE_PROVIDER_SITE_OTHER): Payer: No Typology Code available for payment source | Admitting: Otolaryngology

## 2021-03-20 DIAGNOSIS — J310 Chronic rhinitis: Secondary | ICD-10-CM

## 2021-03-20 DIAGNOSIS — H6983 Other specified disorders of Eustachian tube, bilateral: Secondary | ICD-10-CM

## 2021-03-20 NOTE — Progress Notes (Addendum)
HPI: Caitlin Cain is a 45 y.o. female who presents is referred by her PCP for evaluation of ear complaints.  She states that she has always had nasal sinus issues and uses NeilMed sinus rinse as well as Nasacort.  She also occasionally uses Xyzal. However 2 months ago she contracted COVID and since that time has had problems with her hearing.  She initially had a fair amount of hearing loss but more recently feels like the hearing is doing much better.  She was diagnosed with serous otitis. She also states that she has occasional sinus problems and headaches involving her forehead.  She is having no drainage from her nose and minimal nasal congestion presently. She was referred here because of eustachian tube problems.  She stated that her hearing tended fluctuate but is doing better now..  Past Medical History:  Diagnosis Date   Abnormal Pap smear of cervix    Anxiety    situational with pregnancy   Candida infection of genital region 10/21/2017   Recurrent infection    Gestational diabetes    Impaired glucose tolerance test 08/26/2017   Needs post partum glucola   Osteoarthritis of right knee 01/05/2020   Scoliosis    Skin cancer    Uterine fibroid    Vaginal Pap smear, abnormal    Past Surgical History:  Procedure Laterality Date   CESAREAN SECTION N/A 11/03/2017   Procedure: PRIMARY CESAREAN SECTION;  Surgeon: Guss Bunde, MD;  Location: [facility];  Service: Obstetrics;  Laterality: N/A;   HYSTERECTOMY ABDOMINAL WITH SALPINGECTOMY Bilateral 12/15/2017   Procedure: HYSTERECTOMY ABDOMINAL WITH SALPINGECTOMY;  Surgeon: Lavonia Drafts, MD;  Location: [facility];  Service: Gynecology;  Laterality: Bilateral;   lymph node removal     neck benign   Social History   Socioeconomic History   Marital status: Single    Spouse name: Not on file   Number of children: 1   Years of education: Not on file   Highest education level: Master's degree (e.g., MA, MS, MEng,  MEd, MSW, MBA)  Occupational History   Occupation: Education officer, museum  Tobacco Use   Smoking status: Never   Smokeless tobacco: Never  Vaping Use   Vaping Use: Never used  Substance and Sexual Activity   Alcohol use: No   Drug use: No   Sexual activity: Yes    Partners: Male    Birth control/protection: None, Surgical    Comment: Hysterectomy  Other Topics Concern   Not on file  Social History Narrative   Not on file   Social Determinants of Health   Financial Resource Strain: Not on file  Food Insecurity: Not on file  Transportation Needs: Not on file  Physical Activity: Not on file  Stress: Not on file  Social Connections: Not on file   Family History  Problem Relation Age of Onset   Breast cancer Mother    Heart disease Father    Breast cancer Maternal Aunt    Cancer Maternal Grandfather    Breast cancer Maternal Grandmother    Allergies  Allergen Reactions   Adhesive [Tape] Other (See Comments)    Red bumps and blisters   Codeine Swelling   Prior to Admission medications   Medication Sig Start Date End Date Taking? Authorizing Provider  albuterol (VENTOLIN HFA) 108 (90 Base) MCG/ACT inhaler Inhale 1-2 puffs into the lungs every 4 (four) hours as needed for wheezing or shortness of breath. 02/11/21   Orma Render, NP  amoxicillin-clavulanate (AUGMENTIN) 875-125 MG tablet Take 1 tablet by mouth 2 (two) times daily. 02/11/21   Orma Render, NP  amphetamine-dextroamphetamine (ADDERALL) 20 MG tablet Take 1 tablet by mouth twice a day as directed 02/08/21     amphetamine-dextroamphetamine (ADDERALL) 20 MG tablet Take 1 tablet by mouth twice a day as directed. 03/09/21     benzonatate (TESSALON) 200 MG capsule Take 1 capsule (200 mg total) by mouth 3 (three) times daily as needed for cough. 02/11/21   Orma Render, NP  budesonide (RHINOCORT AQUA) 32 MCG/ACT nasal spray Place 2 sprays into both nostrils daily. 01/31/21   Orma Render, NP  fluconazole (DIFLUCAN) 150 MG tablet Take 1  tablet (150 mg total) by mouth daily. 02/11/21   Orma Render, NP  hydrOXYzine (ATARAX/VISTARIL) 25 MG tablet TAKE 1/2 TO 2 TABLETS BY MOUTH TWICE A DAY AS NEEDED 12/12/20 12/12/21    hydrOXYzine (ATARAX/VISTARIL) 25 MG tablet TAKE 1/2 TO 2 TABLETS BY MOUTH TWICE A DAY AS NEEDED 10/15/20 10/15/21    hydrOXYzine (ATARAX/VISTARIL) 25 MG tablet TAKE 1/2 TO 2 TABLETS BY MOUTH TWO TIMES DAILY AS NEEDED 08/16/20 08/16/21    hydrOXYzine (ATARAX/VISTARIL) 25 MG tablet Take 1/2 to 2 tablets by mouth twice a day as needed 02/08/21     levocetirizine (XYZAL) 5 MG tablet Take 1 tablet (5 mg total) by mouth every evening. 01/31/21   Orma Render, NP  LORazepam (ATIVAN) 0.5 MG tablet TAKE 1 TABLET BY MOUTH ONCE A DAY AS NEEDED 12/17/20 06/15/21    OVER THE COUNTER MEDICATION Multiple Vitamin-Gummie-Take 1 by mouth daily.    [provider]  oxyCODONE (OXY IR/ROXICODONE) 5 MG immediate release tablet Take 1 tablet by mouth every 4 to 6 hours as needed for 7 days. 02/25/21        Positive ROS: Otherwise negative  All other systems have been reviewed and were otherwise negative with the exception of those mentioned in the HPI and as above.  Physical Exam: Constitutional: Alert, well-appearing, no acute distress Ears: External ears without lesions or tenderness. Ear canals are clear bilaterally.  Both TMs are clear with no middle ear effusion noted presently.  On hearing screening with the tuning forks Weber was midline and AC was greater than BC bilaterally with a 512 tuning fork.  Subjectively with the 1024 tuning fork she her little bit better in the right ear than the left but she had a mild hearing loss subjectively compared to my hearing in both ears. Nasal: External nose without lesions. Septum relatively midline.  Mild rhinitis.  Both middle meatus regions are widely patent and clear.  No polyps noted..  Oral: Lips and gums without lesions. Tongue and palate mucosa without lesions. Posterior oropharynx  clear. Neck: No palpable adenopathy or masses Respiratory: Breathing comfortably  Skin: No facial/neck lesions or rash noted.  Audiologic testing demonstrated normal hearing in both ears except for a mild right ear conductive loss above 3000 frequency.  SRT's were 10 DB on the right and 5 DB on the left.  Of note tympanometry revealed type A on the right and type C on the left.  Procedures  Assessment: Normal TMs on exam in the office today. On audiologic testing patient had essentially normal hearing in both ears but was noted to have negative middle ear pressure on the left side but normal hearing on the left side.  She was also noted to have a mild upper frequency right ear conductive loss.  Plan: Reviewed with the patient concerning normal exam today with no signs of middle ear effusion or active sinus infection. Agree with regular use of nasal steroid spray as this will help with the sinuses as well as eustachian tube dysfunction. She inquires as to whether the COVID caused her hearing loss since this is when she first noticed problems. Discussed with her that her hearing test is essentially normal although she did have some negative pressure on the left side.  Would recommend use of nasal steroid spray on a regular basis and this should gradually resolve.  Possibly could be related to COVID causing increased nasal sinus congestion problems.  But there is no significant damage to her cochlea or inner ear hearing.   Radene Journey, MD   CC:

## 2021-04-09 ENCOUNTER — Other Ambulatory Visit (HOSPITAL_COMMUNITY): Payer: Self-pay

## 2021-04-09 MED ORDER — AMPHETAMINE-DEXTROAMPHETAMINE 20 MG PO TABS
ORAL_TABLET | ORAL | 0 refills | Status: DC
  Filled 2021-04-09: qty 60, 30d supply, fill #0
  Filled ????-??-??: fill #0

## 2021-04-09 MED ORDER — AMPHETAMINE-DEXTROAMPHETAMINE 20 MG PO TABS
ORAL_TABLET | ORAL | 0 refills | Status: DC
  Filled 2021-05-08: qty 60, 30d supply, fill #0

## 2021-04-09 MED ORDER — HYDROXYZINE HCL 25 MG PO TABS
ORAL_TABLET | ORAL | 1 refills | Status: DC
  Filled 2021-05-07: qty 90, 22d supply, fill #0
  Filled 2021-11-19: qty 90, 22d supply, fill #1

## 2021-04-09 MED FILL — Hydroxyzine HCl Tab 25 MG: ORAL | 22 days supply | Qty: 90 | Fill #0 | Status: AC

## 2021-05-07 ENCOUNTER — Other Ambulatory Visit (HOSPITAL_COMMUNITY): Payer: Self-pay

## 2021-05-08 ENCOUNTER — Other Ambulatory Visit (HOSPITAL_COMMUNITY): Payer: Self-pay

## 2021-06-03 ENCOUNTER — Other Ambulatory Visit (HOSPITAL_COMMUNITY): Payer: Self-pay

## 2021-06-03 MED ORDER — AMPHETAMINE-DEXTROAMPHETAMINE 20 MG PO TABS
ORAL_TABLET | ORAL | 0 refills | Status: DC
  Filled 2021-06-03 – 2021-06-05 (×2): qty 60, 30d supply, fill #0

## 2021-06-03 MED ORDER — HYDROXYZINE HCL 25 MG PO TABS
ORAL_TABLET | ORAL | 1 refills | Status: DC
  Filled 2021-06-03: qty 90, 22d supply, fill #0
  Filled 2021-07-03: qty 90, 22d supply, fill #1

## 2021-06-05 ENCOUNTER — Other Ambulatory Visit (HOSPITAL_COMMUNITY): Payer: Self-pay

## 2021-06-23 ENCOUNTER — Emergency Department: Admit: 2021-06-23 | Payer: Self-pay

## 2021-07-03 ENCOUNTER — Other Ambulatory Visit (HOSPITAL_COMMUNITY): Payer: Self-pay

## 2021-07-03 MED ORDER — AMPHETAMINE-DEXTROAMPHETAMINE 20 MG PO TABS
ORAL_TABLET | ORAL | 0 refills | Status: DC
  Filled 2021-07-03: qty 60, 30d supply, fill #0

## 2021-07-04 ENCOUNTER — Other Ambulatory Visit (HOSPITAL_BASED_OUTPATIENT_CLINIC_OR_DEPARTMENT_OTHER): Payer: Self-pay | Admitting: Nurse Practitioner

## 2021-07-10 ENCOUNTER — Encounter (INDEPENDENT_AMBULATORY_CARE_PROVIDER_SITE_OTHER): Payer: Self-pay

## 2021-07-16 ENCOUNTER — Other Ambulatory Visit (HOSPITAL_COMMUNITY): Payer: Self-pay

## 2021-07-16 MED ORDER — MELOXICAM 7.5 MG PO TABS
ORAL_TABLET | ORAL | 1 refills | Status: DC
  Filled 2021-07-16: qty 30, 30d supply, fill #0

## 2021-07-25 ENCOUNTER — Other Ambulatory Visit (HOSPITAL_COMMUNITY): Payer: Self-pay

## 2021-07-30 ENCOUNTER — Other Ambulatory Visit (HOSPITAL_COMMUNITY): Payer: Self-pay

## 2021-07-30 MED ORDER — AMPHETAMINE-DEXTROAMPHETAMINE 20 MG PO TABS
ORAL_TABLET | ORAL | 0 refills | Status: DC
  Filled 2021-07-30: qty 60, 30d supply, fill #0

## 2021-07-30 MED ORDER — HYDROXYZINE HCL 25 MG PO TABS
ORAL_TABLET | ORAL | 1 refills | Status: DC
  Filled 2021-07-30: qty 90, 25d supply, fill #0
  Filled 2021-08-26: qty 90, 25d supply, fill #1

## 2021-08-26 ENCOUNTER — Other Ambulatory Visit (HOSPITAL_COMMUNITY): Payer: Self-pay

## 2021-08-26 MED ORDER — AMPHETAMINE-DEXTROAMPHETAMINE 20 MG PO TABS
20.0000 mg | ORAL_TABLET | Freq: Two times a day (BID) | ORAL | 0 refills | Status: DC
  Filled 2021-08-26: qty 60, 30d supply, fill #0

## 2021-08-28 ENCOUNTER — Other Ambulatory Visit: Payer: Self-pay

## 2021-08-28 ENCOUNTER — Ambulatory Visit (INDEPENDENT_AMBULATORY_CARE_PROVIDER_SITE_OTHER): Payer: No Typology Code available for payment source | Admitting: Plastic Surgery

## 2021-08-28 VITALS — BP 108/72 | HR 87 | Wt 140.0 lb

## 2021-08-28 DIAGNOSIS — C44319 Basal cell carcinoma of skin of other parts of face: Secondary | ICD-10-CM

## 2021-08-28 DIAGNOSIS — Z411 Encounter for cosmetic surgery: Secondary | ICD-10-CM

## 2021-08-28 NOTE — Progress Notes (Signed)
Referring Provider Early, Coralee Pesa, NP [place],  [place] [address]   CC:  Chief Complaint  Patient presents with   Advice Only      Yuliana Vandrunen is an 45 y.o. female.  HPI: Patient presents in follow-up for new skin lesions on her face.  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  She had also had skin cancers excised previously through dermatologist in [place].  She feels like she has noticed a new spot on her temple that will intermittently bleed and others in the lateral brow area.  She wants to get those checked.  She would also like to discuss her abdominal contour.  She has had C-sections through a lower transverse incision and then ultimately a hysterectomy through a low vertical incision.  She is bothered by the rippling of the contour in the lower abdomen.  She does have a history of umbilical hernia repair as well.  Review of Systems General: Denies fevers and chills  Physical Exam Vitals with BMI 08/28/2021 01/31/2021 02/27/2020  Height - 5\' 9"  5\' 9"   Weight 140 lbs 145 lbs 10 oz 146 lbs 10 oz  BMI - 70.17 79.39  Systolic 030 092 330  Diastolic 72 61 79  Pulse 87 75 83    General:  No acute distress,  Alert and oriented, Non-Toxic, Normal speech and affect On exam she has a approximately 8 mm ulcerative lesion in the right temple area.  It looks to have fairly well-defined borders.  The areas in the lateral brow on the right and the left are smaller and just a few millimeters in size but suspect that they would be early basal cell as well given her history.  She is healed reasonably well from the excisions in the forehead and shoulder. Abdomen: Abdomen is soft nontender.  No obvious hernias.  Healed scars vertically infraumbilical area and low transverse C-section scar.  She has mild to moderate excess skin in the supra and infraumbilical areas.  Assessment/Plan Patient presents with a new suspicious lesion in the  right temple area.  We discussed a number of options but ultimately I recommended excision.  She is in agreement with this.  We reviewed the risks and benefits and the anticipated scar.  I did speak with her about having good dermatology evaluation on a regular basis.  She is interested in a second opinion regarding a dermatologist and we will try to facilitate that for her.  Regarding the abdomen I recommended abdominoplasty.  With a full abdominoplasty I suspect I would be able to excise the entirety of her lower vertical scar in addition to the C-section scar.  That would help quite a bit with the contour in addition to flattening that area.  I suspect she has some mild rectus diastases as well particularly in the lower abdomen.  I would do the limited liposuction along with this and a full plication.  We discussed risks and benefits of the procedure that include bleeding, infection, damage to surrounding structures need for additional procedures and the potential for persistent contour irregularities.  We also briefly discussed other modalities including mini tummy tuck and liposuction alone but I feel like giving the scarring that she has that a full abdominoplasty would give her the best result.  We discussed the general expectations for postoperative recovery.  She is interested in getting some more information about this.  Cindra Presume 08/28/2021, 5:41 PM

## 2021-09-18 ENCOUNTER — Ambulatory Visit: Payer: No Typology Code available for payment source | Admitting: Plastic Surgery

## 2021-09-24 ENCOUNTER — Other Ambulatory Visit (HOSPITAL_COMMUNITY): Payer: Self-pay

## 2021-09-24 MED ORDER — AMPHETAMINE-DEXTROAMPHETAMINE 20 MG PO TABS
ORAL_TABLET | ORAL | 0 refills | Status: DC
  Filled 2021-10-23: qty 60, 30d supply, fill #0

## 2021-09-24 MED ORDER — AMPHETAMINE-DEXTROAMPHETAMINE 20 MG PO TABS: ORAL_TABLET | ORAL | 0 refills | Status: DC

## 2021-09-24 MED ORDER — AMPHETAMINE-DEXTROAMPHETAMINE 20 MG PO TABS
ORAL_TABLET | ORAL | 0 refills | Status: DC
  Filled 2021-09-24: qty 60, 30d supply, fill #0

## 2021-09-24 MED ORDER — HYDROXYZINE HCL 25 MG PO TABS
ORAL_TABLET | ORAL | 2 refills | Status: DC
  Filled 2021-09-24: qty 90, 23d supply, fill #0
  Filled 2021-10-23: qty 90, 23d supply, fill #1

## 2021-09-26 ENCOUNTER — Ambulatory Visit: Payer: No Typology Code available for payment source | Admitting: Surgical

## 2021-10-21 ENCOUNTER — Other Ambulatory Visit (HOSPITAL_COMMUNITY): Payer: Self-pay

## 2021-10-21 ENCOUNTER — Encounter (HOSPITAL_BASED_OUTPATIENT_CLINIC_OR_DEPARTMENT_OTHER): Payer: Self-pay | Admitting: Nurse Practitioner

## 2021-10-21 MED ORDER — AZITHROMYCIN 250 MG PO TABS: ORAL_TABLET | ORAL | 0 refills | Status: DC

## 2021-10-21 MED ORDER — AZITHROMYCIN 250 MG PO TABS
ORAL_TABLET | ORAL | 0 refills | Status: DC
  Filled 2021-10-21: qty 6, 5d supply, fill #0

## 2021-10-21 NOTE — Addendum Note (Signed)
Addended by: Campbell Riches on: 10/21/2021 02:58 PM   Modules accepted: Orders

## 2021-10-23 ENCOUNTER — Other Ambulatory Visit (HOSPITAL_COMMUNITY): Payer: Self-pay

## 2021-11-19 ENCOUNTER — Other Ambulatory Visit (HOSPITAL_COMMUNITY): Payer: Self-pay

## 2021-11-20 ENCOUNTER — Other Ambulatory Visit (HOSPITAL_COMMUNITY): Payer: Self-pay

## 2021-11-20 MED ORDER — AMPHETAMINE-DEXTROAMPHETAMINE 20 MG PO TABS
ORAL_TABLET | ORAL | 0 refills | Status: DC
  Filled 2021-11-20: qty 60, 30d supply, fill #0

## 2021-11-21 ENCOUNTER — Other Ambulatory Visit (HOSPITAL_COMMUNITY): Payer: Self-pay

## 2021-12-10 ENCOUNTER — Ambulatory Visit (INDEPENDENT_AMBULATORY_CARE_PROVIDER_SITE_OTHER): Payer: No Typology Code available for payment source | Admitting: Nurse Practitioner

## 2021-12-10 ENCOUNTER — Encounter (HOSPITAL_BASED_OUTPATIENT_CLINIC_OR_DEPARTMENT_OTHER): Payer: Self-pay | Admitting: Nurse Practitioner

## 2021-12-10 ENCOUNTER — Other Ambulatory Visit: Payer: Self-pay

## 2021-12-10 ENCOUNTER — Other Ambulatory Visit (HOSPITAL_COMMUNITY): Payer: Self-pay

## 2021-12-10 VITALS — BP 128/88 | HR 98 | Ht 69.0 in | Wt 149.0 lb

## 2021-12-10 DIAGNOSIS — F419 Anxiety disorder, unspecified: Secondary | ICD-10-CM

## 2021-12-10 DIAGNOSIS — F4310 Post-traumatic stress disorder, unspecified: Secondary | ICD-10-CM

## 2021-12-10 DIAGNOSIS — J0140 Acute pansinusitis, unspecified: Secondary | ICD-10-CM | POA: Insufficient documentation

## 2021-12-10 DIAGNOSIS — Z1231 Encounter for screening mammogram for malignant neoplasm of breast: Secondary | ICD-10-CM

## 2021-12-10 DIAGNOSIS — Z77120 Contact with and (suspected) exposure to mold (toxic): Secondary | ICD-10-CM

## 2021-12-10 MED ORDER — AMOXICILLIN-POT CLAVULANATE 875-125 MG PO TABS
1.0000 | ORAL_TABLET | Freq: Two times a day (BID) | ORAL | 0 refills | Status: DC
  Filled 2021-12-10: qty 10, 5d supply, fill #0

## 2021-12-10 MED ORDER — VORICONAZOLE 200 MG PO TABS
200.0000 mg | ORAL_TABLET | Freq: Two times a day (BID) | ORAL | 1 refills | Status: DC
  Filled 2021-12-10: qty 4, 2d supply, fill #0
  Filled 2021-12-10: qty 10, 5d supply, fill #0

## 2021-12-10 NOTE — Patient Instructions (Signed)
Let me know if you are not having any improvement in the next few days. Keep taking the probiotic.  ?
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
## 2021-12-11 ENCOUNTER — Other Ambulatory Visit (HOSPITAL_COMMUNITY): Payer: Self-pay

## 2021-12-14 NOTE — Assessment & Plan Note (Addendum)
Symptoms and presentation consistent with sinusitis. Recent mold exposure makes bacterial vs fungal etiology in question. Unclear if causative factor related to possible mold exposure or change in the seasons and environment.  Will send treatment with antibiotics and antifungal today.  No respiratory alarm symptoms present on auscultation today.  Recommend continuation of probiotics and monitoring for new or worsening symptoms.  If symptoms do not improve or lung symptoms start to worsen recommend return for sputum culture and CXR.

## 2021-12-15 NOTE — Assessment & Plan Note (Addendum)
Symptoms and presentation consistent with infection. Suspect recent mold exposure contributing. Will send treatment with oral antibiotics and antifungal agents.  ?Will continue to monitor symptoms and patient will return for f/u if productive cough of new symptoms appear, will obtain sputum culture and cxr, at this time no sputum present for evaluation. ?
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
## 2021-12-17 ENCOUNTER — Other Ambulatory Visit (HOSPITAL_COMMUNITY): Payer: Self-pay

## 2021-12-17 MED ORDER — HYDROXYZINE HCL 25 MG PO TABS
ORAL_TABLET | ORAL | 0 refills | Status: AC
  Filled 2021-12-17: qty 90, 22d supply, fill #0

## 2021-12-17 MED ORDER — CELECOXIB 200 MG PO CAPS
ORAL_CAPSULE | ORAL | 1 refills | Status: DC
  Filled 2021-12-17: qty 30, 30d supply, fill #0

## 2021-12-17 MED ORDER — AMPHETAMINE-DEXTROAMPHETAMINE 20 MG PO TABS: ORAL_TABLET | ORAL | 0 refills | Status: AC

## 2021-12-22 ENCOUNTER — Encounter (HOSPITAL_BASED_OUTPATIENT_CLINIC_OR_DEPARTMENT_OTHER): Payer: Self-pay | Admitting: Nurse Practitioner

## 2021-12-22 ENCOUNTER — Telehealth: Payer: No Typology Code available for payment source | Admitting: Nurse Practitioner

## 2021-12-22 DIAGNOSIS — J028 Acute pharyngitis due to other specified organisms: Secondary | ICD-10-CM

## 2021-12-22 DIAGNOSIS — B9689 Other specified bacterial agents as the cause of diseases classified elsewhere: Secondary | ICD-10-CM | POA: Diagnosis not present

## 2021-12-22 MED ORDER — CEPHALEXIN 500 MG PO CAPS: 500.0000 mg | ORAL_CAPSULE | Freq: Two times a day (BID) | ORAL | 0 refills | Status: AC

## 2021-12-22 NOTE — Patient Instructions (Signed)
?[name], thank you for joining Gildardo Pounds, NP for today's virtual visit.  While this provider is not your primary care provider (PCP), if your PCP is located in our provider database this encounter information will be shared with them immediately following your visit. ? ?Consent: ?[name] provided verbal consent for this virtual visit at the beginning of the encounter. ? ?Current Medications: ? ?Current Outpatient Medications:  ?  cephALEXin (KEFLEX) 500 MG capsule, Take 1 capsule (500 mg total) by mouth 2 (two) times daily for 7 days., Disp: 14 capsule, Rfl: 0 ?  albuterol (VENTOLIN HFA) 108 (90 Base) MCG/ACT inhaler, Inhale 1-2 puffs into the lungs every 4 (four) hours as needed for wheezing or shortness of breath., Disp: 6.7 g, Rfl: 1 ?  amphetamine-dextroamphetamine (ADDERALL) 20 MG tablet, Take 1 tablet by mouth twice a day as directed, Disp: 60 tablet, Rfl: 0 ?  amphetamine-dextroamphetamine (ADDERALL) 20 MG tablet, Take 1 tablet by mouth twice a day as directed., Disp: 60 tablet, Rfl: 0 ?  amphetamine-dextroamphetamine (ADDERALL) 20 MG tablet, Take 1 tablet by mouth twice a day as directed, Disp: 60 tablet, Rfl: 0 ?  amphetamine-dextroamphetamine (ADDERALL) 20 MG tablet, Take 1 tablet by mouth twice a day as directed, Disp: 60 tablet, Rfl: 0 ?  amphetamine-dextroamphetamine (ADDERALL) 20 MG tablet, Take 1 tablet by mouth twice a day as directed. May fill 11/22/2021, Disp: 60 tablet, Rfl: 0 ?  amphetamine-dextroamphetamine (ADDERALL) 20 MG tablet, Take 1 tablet by mouth twice a day as directed., Disp: 60 tablet, Rfl: 0 ?  amphetamine-dextroamphetamine (ADDERALL) 20 MG tablet, Take 1 tablet by mouth twice a day as directed, Disp: 60 tablet, Rfl: 0 ?  amphetamine-dextroamphetamine (ADDERALL) 20 MG tablet, Take 1 tablet by mouth twice a day as directed, Disp: 60 tablet, Rfl: 0 ?  budesonide (RHINOCORT AQUA) 32 MCG/ACT nasal spray, Place 2 sprays into both nostrils daily.,  Disp: 8.43 mL, Rfl: 6 ?  celecoxib (CELEBREX) 200 MG capsule, Take 1 capsule by mouth daily if needed. Take with a meal., Disp: 30 capsule, Rfl: 1 ?  hydrOXYzine (ATARAX) 25 MG tablet, Take 1/2 to 2 tablet by mouth twice a day as needed, Disp: 90 tablet, Rfl: 1 ?  hydrOXYzine (ATARAX) 25 MG tablet, Take 1/2 to 2 tablet by mouth twice a day as needed, Disp: 90 tablet, Rfl: 2 ?  hydrOXYzine (ATARAX) 25 MG tablet, Take 1/2 to 2 tablet by mouth twice a day as needed, Disp: 90 tablet, Rfl: 0 ?  hydrOXYzine (ATARAX/VISTARIL) 25 MG tablet, Take 1/2 to 2 tablets by mouth twice a day as needed, Disp: 90 tablet, Rfl: 1 ?  hydrOXYzine (ATARAX/VISTARIL) 25 MG tablet, Take 1/2 to 2 tablets by mouth twice a day as needed, Disp: 90 tablet, Rfl: 1 ?  hydrOXYzine (ATARAX/VISTARIL) 25 MG tablet, Take 1/2 to 2 tablets by mouth twice a day as needed, Disp: 90 tablet, Rfl: 1 ?  levocetirizine (XYZAL) 5 MG tablet, Take 1 tablet (5 mg total) by mouth every evening., Disp: 30 tablet, Rfl: 11 ?  meloxicam (MOBIC) 7.5 MG tablet, Take 1 tablet by mouth daily with a meal as needed, Disp: 30 tablet, Rfl: 1 ?  OVER THE COUNTER MEDICATION, Multiple Vitamin-Gummie-Take 1 by mouth daily., Disp: , Rfl:  ?  voriconazole (VFEND) 200 MG tablet, Take 1 tablet (200 mg total) by mouth 2 (two) times daily., Disp: 14 tablet, Rfl: 1  ? ?Medications ordered in this encounter:  ?Meds ordered this encounter  ?  Medications  ? cephALEXin (KEFLEX) 500 MG capsule  ?  Sig: Take 1 capsule (500 mg total) by mouth 2 (two) times daily for 7 days.  ?  Dispense:  14 capsule  ?  Refill:  0  ?  Order Specific Question:   Supervising Provider  ?  Answer:   Noemi Chapel [[id number]]  ?  ? ?*If you need refills on other medications prior to your next appointment, please contact your pharmacy* ? ?Follow-Up: ?Call back or seek an in-person evaluation if the symptoms worsen or if the condition fails to improve as anticipated. ? ?Other Instructions ?Continue warm salt water gargles as  instructed as well as advil ? ? ?If you have been instructed to have an in-person evaluation today at a local Urgent Care facility, please use the link below. It will take you to a list of all of our available [facility], including address, phone number and hours of operation. Please do not delay care.  ?[facility] ? ?If you or a family member do not have a primary care provider, use the link below to schedule a visit and establish care. When you choose a [facility] primary care physician or advanced practice provider, you gain a long-term partner in health. ?Find a Primary Care Provider ? ?Learn more about 's in-office and virtual care options: ?[facility]  ?
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
## 2022-01-20 ENCOUNTER — Ambulatory Visit (INDEPENDENT_AMBULATORY_CARE_PROVIDER_SITE_OTHER): Payer: No Typology Code available for payment source | Admitting: Psychologist

## 2022-01-20 DIAGNOSIS — F4310 Post-traumatic stress disorder, unspecified: Secondary | ICD-10-CM | POA: Diagnosis not present

## 2022-01-20 NOTE — Progress Notes (Signed)
                Krislyn Donnan, PsyD 

## 2022-01-20 NOTE — Plan of Care (Signed)
Goals Reduce overall frequency, intensity, and duration of trauma related symptoms Stabilize  trauma while increasing ability to function Enhance ability to effectively cope with triggers, reminders, flashbacks, moods, and cognitions of trauma Learn and implement coping skills that result in a reduction of trauma related symptomsl  Objectives Verbalize an understanding of the cognitive, physiological, and behavioral components of post traumatic stress disorder Describe in detail reminders of the triggers related to trauma  Learn and implement strategies to manage thoughts, behaviors, and feelings of trauma Learning and implement calming skills to reduce overall trauma Verbalize an understanding of the role that cognitive biases play in excessive irrational worry and persistent trauma symptoms Learn and implement personal skills to manage challenging situations Learn and implement problem solving strategies Identify and engage in pleasant activities Learn to accept limitations in life and commit to tolerating, rather than avoiding, unpleasant emotions while accomplishing meaningful goals Maintain involvement in work, family, and social activities Reestablish a consistent sleep-wake cycle Cooperate with a medical evaluation  Interventions Engage the patient in behavioral activation Use instruction, modeling, and role-playing to build the client's general social, communication, and/or conflict resolution skills Use Acceptance and Commitment Therapy to help client accept uncomfortable realities in order to accomplish value-consistent goals Support the client in following through with work, family, and social activities Teach and implement sleep hygiene practices  Refer the patient to a physician for a psychotropic medication consultation Monito the clint's psychotropic medication compliance Discuss how trauma typically involves excessive worry, various bodily expressions of tension, and  avoidance of what is threatening that interact to maintain the problem  Teach the patient relaxation skills Assign the patient homework Discuss examples demonstrating that unrealistic worry overestimates the probability of threats and underestimates patient's ability  Assist the patient in analyzing his or her worries Help patient understand that avoidance is reinforcing   
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
## 2022-01-21 ENCOUNTER — Ambulatory Visit
Admission: RE | Admit: 2022-01-21 | Discharge: 2022-01-21 | Disposition: A | Discharge status: Home / Self Care | Payer: No Typology Code available for payment source | Source: Ambulatory Visit | Attending: Nurse Practitioner | Admitting: Nurse Practitioner

## 2022-01-21 DIAGNOSIS — Z1231 Encounter for screening mammogram for malignant neoplasm of breast: Secondary | ICD-10-CM

## 2022-02-04 ENCOUNTER — Other Ambulatory Visit: Payer: Self-pay | Admitting: Nurse Practitioner

## 2022-02-04 DIAGNOSIS — R928 Other abnormal and inconclusive findings on diagnostic imaging of breast: Secondary | ICD-10-CM

## 2022-02-05 ENCOUNTER — Encounter (HOSPITAL_BASED_OUTPATIENT_CLINIC_OR_DEPARTMENT_OTHER): Payer: Self-pay | Admitting: Nurse Practitioner

## 2022-02-10 ENCOUNTER — Other Ambulatory Visit (HOSPITAL_COMMUNITY): Payer: Self-pay

## 2022-02-10 ENCOUNTER — Encounter (HOSPITAL_BASED_OUTPATIENT_CLINIC_OR_DEPARTMENT_OTHER): Payer: Self-pay | Admitting: Nurse Practitioner

## 2022-02-10 ENCOUNTER — Ambulatory Visit (INDEPENDENT_AMBULATORY_CARE_PROVIDER_SITE_OTHER): Payer: No Typology Code available for payment source | Admitting: Nurse Practitioner

## 2022-02-10 DIAGNOSIS — J0140 Acute pansinusitis, unspecified: Secondary | ICD-10-CM | POA: Diagnosis not present

## 2022-02-10 MED ORDER — FLUCONAZOLE 150 MG PO TABS
ORAL_TABLET | ORAL | 2 refills | Status: DC
  Filled 2022-02-10: qty 2, 3d supply, fill #0

## 2022-02-10 MED ORDER — HYDROCOD POLI-CHLORPHE POLI ER 10-8 MG/5ML PO SUER
5.0000 mL | Freq: Every evening | ORAL | 0 refills | Status: DC | PRN
  Filled 2022-02-10: qty 70, 14d supply, fill #0

## 2022-02-10 MED ORDER — AMOXICILLIN-POT CLAVULANATE 875-125 MG PO TABS
1.0000 | ORAL_TABLET | Freq: Two times a day (BID) | ORAL | 0 refills | Status: DC
  Filled 2022-02-10: qty 10, 5d supply, fill #0

## 2022-02-10 NOTE — Assessment & Plan Note (Signed)
Symptoms and presentation consistent with acute nonrecurrent pansinusitis.  Given the length of time symptoms have been present, presence of fevers, and purulent mucus production we will begin therapy with antibiotic treatment today.  Recommend increased hydration and rest.  We will also send Tussionex for cough.  Patient has tolerated hydrocodone in the past without issue. ?Continue with sinus rinses as directed and nasal spray to help with inflammation in the ears and sinus tach stitches.  May also consider Mucinex for reduced mucus production and improved efficiency of cough. ?We will add Diflucan as patient often gets vaginal yeast infection with use of antibiotics. ?Follow-up if symptoms worsen or fail to improve. ?

## 2022-02-10 NOTE — Patient Instructions (Signed)
Your symptoms today are consistent with a sinus infection.  ? ?I have sent in Augmentin, Tussionex (cough syrup) and diflucan for treatment today.  ?If you are not feeling better in the next 5 days, please let us know.  ? ?The following information is provided as a Human resources officer for ADULT patients only and does NOT take into account PREGNANCY, ALLERGIES, LIVER CONDITIONS, KIDNEY CONDITIONS, GASTROINTESTINAL CONDITIONS, OR PRESCRIPTION MEDICATION INTERACTIONS. ?Please be sure to ask your provider if the following are safe to take with your specific medical history, conditions, or current medication regimen if you are unsure.  ? ?Adult Basic Symptom Management for Sinusitis ? ?Congestion: Guaifenesin (Mucinex)- follow directions on packaging with a maximum dose of '2400mg'$  in a 24 hour period. ? ?Pain/Fever: Ibuprofen '200mg'$  - '400mg'$  every 4-6 hours as needed (MAX '1200mg'$  in a 24 hour period) ?Pain/Fever: Tylenol '500mg'$  -'1000mg'$  every 6-8 hours as needed (MAX '3000mg'$  in a 24 hour period) ? ?Cough: Dextromethorphan (Delsym)- follow directions on packing with a maximum dose of '120mg'$  in a 24 hour period. ? ?Nasal Stuffiness: Saline nasal spray and/or Nettie Pot with sterile saline solution ? ?Runny Nose: Fluticasone nasal spray (Flonase) OR Mometasone nasal spray (Nasonex) OR Triamcinolone Acetonide nasal spray (Nasacort)- follow directions on the packaging ? ?Pain/Pressure: Warm washcloth to the face  ?Sore Throat: Warm salt water gargles ? ?If you have allergies, you may also consider taking an oral antihistamine (like Zyrtec or Claritin) as these may also help with your symptoms. ? ?**Many medications will have more than one ingredient, be sure you are reading the packaging carefully and not taking more than one dose of the same kind of medication at the same time or too close together. ?It is OK to use formulas that have all of the ingredients you want, but do not take them in a combined medication and as separate dose  too close together. If you have any questions, the pharmacist will be happy to help you decide what is safe. ? ? ?

## 2022-02-10 NOTE — Progress Notes (Signed)
Virtual Visit Encounter telephone visit. ? ? ?I connected with  Caitlin Cain on 02/10/22 at 11:10 AM EDT by secure audio telemedicine application. I verified that I am speaking with the correct person using two identifiers. ?  ?I introduced myself as a Designer, jewellery with the practice. The limitations of evaluation and management by telemedicine discussed with the patient and the availability of in person appointments. The patient expressed verbal understanding and consent to proceed. ? ?Participating parties in this visit include: Myself and patient ? ?The patient is: Patient Location: Home ?I am: Provider Location: Office/Clinic ?Subjective:   ? ?CC and HPI: Caitlin Cain is a 46 y.o. year old female presenting for new evaluation and treatment of sore throat, fever, cough, and fever off and on for about a week. ?She endorses dark brown/green mucous with coughing ?Cough is keeping her up at night ?Sinus pain and pressure present.  ?In the morning she is also noticing dark mucous from the sinuses ?She is using sinus spray and sinus rinses routinely ?Her daughter has symptoms as well and has been started on antibiotics.  ? ?Past medical history, Surgical history, Family history not pertinant except as noted below, Social history, Allergies, and medications have been entered into the medical record, reviewed, and corrections made.  ? ?Review of Systems:  ?All review of systems negative except what is listed in the HPI ? ?Objective:   ? ?Alert and oriented x 4 ?Speaking in clear sentences with no shortness of breath. ?Congestion present. Hoarse voice present ?No distress. ? ?Impression and Recommendations:   ? ?Problem List Items Addressed This Visit   ? ? Acute non-recurrent pansinusitis - Primary  ?  Symptoms and presentation consistent with acute nonrecurrent pansinusitis.  Given the length of time symptoms have been present, presence of fevers, and purulent mucus production we will begin therapy  with antibiotic treatment today.  Recommend increased hydration and rest.  We will also send Tussionex for cough.  Patient has tolerated hydrocodone in the past without issue. ?Continue with sinus rinses as directed and nasal spray to help with inflammation in the ears and sinus tach stitches.  May also consider Mucinex for reduced mucus production and improved efficiency of cough. ?We will add Diflucan as patient often gets vaginal yeast infection with use of antibiotics. ?Follow-up if symptoms worsen or fail to improve. ? ?  ?  ? Relevant Medications  ? amoxicillin-clavulanate (AUGMENTIN) 875-125 MG tablet  ? chlorpheniramine-HYDROcodone 10-8 MG/5ML  ? fluconazole (DIFLUCAN) 150 MG tablet  ? ? ?orders and follow up as documented in EMR ?I discussed the assessment and treatment plan with the patient. The patient was provided an opportunity to ask questions and all were answered. The patient agreed with the plan and demonstrated an understanding of the instructions. ?  ?The patient was advised to call back or seek an in-person evaluation if the symptoms worsen or if the condition fails to improve as anticipated. ? ?Follow-Up: prn ? ?I provided 11 minutes of non-face-to-face interaction with this non face-to-face encounter including intake, same-day documentation, and chart review.  ? ?Orma Render, NP , DNP, AGNP-c ?[facility] ?Primary Care & Sports Medicine at [facility] ?[phone] ?[phone] (fax) ? ?

## 2022-02-11 ENCOUNTER — Encounter (HOSPITAL_BASED_OUTPATIENT_CLINIC_OR_DEPARTMENT_OTHER): Payer: Self-pay | Admitting: Nurse Practitioner

## 2022-02-11 ENCOUNTER — Telehealth (HOSPITAL_BASED_OUTPATIENT_CLINIC_OR_DEPARTMENT_OTHER): Payer: No Typology Code available for payment source | Admitting: Nurse Practitioner

## 2022-02-13 ENCOUNTER — Ambulatory Visit
Admission: RE | Admit: 2022-02-13 | Discharge: 2022-02-13 | Disposition: A | Discharge status: Home / Self Care | Payer: No Typology Code available for payment source | Source: Ambulatory Visit | Attending: Nurse Practitioner | Admitting: Nurse Practitioner

## 2022-02-13 ENCOUNTER — Ambulatory Visit: Payer: No Typology Code available for payment source

## 2022-02-13 ENCOUNTER — Other Ambulatory Visit: Payer: Self-pay | Admitting: Nurse Practitioner

## 2022-02-13 DIAGNOSIS — R928 Other abnormal and inconclusive findings on diagnostic imaging of breast: Secondary | ICD-10-CM

## 2022-02-21 ENCOUNTER — Ambulatory Visit (INDEPENDENT_AMBULATORY_CARE_PROVIDER_SITE_OTHER): Payer: No Typology Code available for payment source | Admitting: Psychologist

## 2022-02-21 DIAGNOSIS — F4310 Post-traumatic stress disorder, unspecified: Secondary | ICD-10-CM

## 2022-02-21 NOTE — Progress Notes (Signed)
[facility] Counselor/Therapist Progress Note  Patient ID: Caitlin Cain, MRN: [id number],    Date: 02/21/2022  Time Spent: 3:02 pm to 3:40 pm; total time: 38 minutes   This session was held via video webex teletherapy due to the coronavirus risk at this time. The patient consented to video teletherapy and was located at her home during this session. She is aware it is the responsibility of the patient to secure confidentiality on her end of the session. The provider was in a private home office for the duration of this session. Limits of confidentiality were discussed with the patient.   Treatment Type: Individual Therapy  Reported Symptoms: Trauma  Mental Status Exam: Appearance:  Well Groomed     Behavior: Appropriate  Motor: Normal  Speech/Language:  Clear and Coherent  Affect: Appropriate  Mood: normal  Thought process: normal  Thought content:   WNL  Sensory/Perceptual disturbances:   WNL  Orientation: oriented to person, place, and time/date  Attention: Good  Concentration: Good  Memory: WNL  Fund of knowledge:  Good  Insight:   Good  Judgment:  Good  Impulse Control: Good   Risk Assessment: Danger to Self:  No Self-injurious Behavior: No Danger to Others: No Duty to Warn:no Physical Aggression / Violence:No  Access to Firearms a concern: No  Gang Involvement:No   Subjective: Beginning the session, patient described herself as doing well following being at a conference. After reviewing the treatment plan, patient spent time processing whether or not she wanted to stay with current therapist or to see one who specializes in CPT. She eventually stated that she wants to stay with current therapist. She stated in the next session she wants to focus on coping skills. She spent part of the session reflecting on some frustrations from her personal and professional life that cross over and affect each other. She ended the session talking about how their  might be some movement in a positive direction related to opening a criminal investigation into her daughter's father. She asked to follow up. She denied suicidal and homicidal ideation.    Interventions:  Worked on developing a therapeutic relationship with the patient using active listening and reflective statements. Provided emotional support using empathy and validation. Reviewed the treatment plan with the patient. Reviewed events since the intake occurred. Normalized and validated expressed thoughts and emotions. Identified goals for the session. Provided psychoeducation about identifying therapists who use CPT in therapy. Provided psychoeducation about the difference between ACT, and CPT. Reflected on patient's goals for therapy and discussed how each orientation could assist the patient. Discussed in the next session focusing on coping skills. Used socratic questions to assist the patient gain insight. Assisted in problem solving. Used summary statements. Processed some of the frustrations being expressed by the patient. Provided empathic statements. Assessed for suicidal and homicidal ideation.   Homework: NA  Next Session: Coping skills  Diagnosis: F43.10 Posttraumatic Stress disorder  Plan:   Goals Reduce overall frequency, intensity, and duration of trauma related symptoms Stabilize  trauma while increasing ability to function Enhance ability to effectively cope with triggers, reminders, flashbacks, moods, and cognitions of trauma Learn and implement coping skills that result in a reduction of trauma related symptomsl  Objectives target date for all objectives is 01/21/2023 Verbalize an understanding of the cognitive, physiological, and behavioral components of post traumatic stress disorder Describe in detail reminders of the triggers related to trauma  Learn and implement strategies to manage thoughts, behaviors, and feelings of  trauma Learning and implement calming skills to  reduce overall trauma Verbalize an understanding of the role that cognitive biases play in excessive irrational worry and persistent trauma symptoms Learn and implement personal skills to manage challenging situations Learn and implement problem solving strategies Identify and engage in pleasant activities Learn to accept limitations in life and commit to tolerating, rather than avoiding, unpleasant emotions while accomplishing meaningful goals Maintain involvement in work, family, and social activities Reestablish a consistent sleep-wake cycle Cooperate with a medical evaluation  Interventions Engage the patient in behavioral activation Use instruction, modeling, and role-playing to build the client's general social, communication, and/or conflict resolution skills Use Acceptance and Commitment Therapy to help client accept uncomfortable realities in order to accomplish value-consistent goals Support the client in following through with work, family, and social activities Teach and implement sleep hygiene practices  Refer the patient to a physician for a psychotropic medication consultation Monito the clint's psychotropic medication compliance Discuss how trauma typically involves excessive worry, various bodily expressions of tension, and avoidance of what is threatening that interact to maintain the problem  Teach the patient relaxation skills Assign the patient homework Discuss examples demonstrating that unrealistic worry overestimates the probability of threats and underestimates patient's ability  Assist the patient in analyzing his or her worries Help patient understand that avoidance is reinforcing   The patient and clinician reviewed the treatment plan on 02/21/2022. The patient approved of the treatment plan.   Conception Chancy, PsyD

## 2022-02-21 NOTE — Progress Notes (Signed)
                Cressida Milford, PsyD 

## 2022-03-19 ENCOUNTER — Ambulatory Visit (INDEPENDENT_AMBULATORY_CARE_PROVIDER_SITE_OTHER): Payer: No Typology Code available for payment source | Admitting: Psychologist

## 2022-03-19 DIAGNOSIS — F4310 Post-traumatic stress disorder, unspecified: Secondary | ICD-10-CM

## 2022-03-19 NOTE — Progress Notes (Signed)
[facility] Counselor/Therapist Progress Note  Patient ID: Caitlin Cain, MRN: [id number],    Date: 03/19/2022  Time Spent: 09:07 am to 9:45 am; total time: 38 minutes   This session was held via video webex teletherapy due to the coronavirus risk at this time. The patient consented to video teletherapy and was located at her home during this session. She is aware it is the responsibility of the patient to secure confidentiality on her end of the session. The provider was in a private home office for the duration of this session. Limits of confidentiality were discussed with the patient.   Treatment Type: Individual Therapy  Reported Symptoms: Trauma  Mental Status Exam: Appearance:  Well Groomed     Behavior: Appropriate  Motor: Normal  Speech/Language:  Clear and Coherent  Affect: Appropriate  Mood: normal  Thought process: normal  Thought content:   WNL  Sensory/Perceptual disturbances:   WNL  Orientation: oriented to person, place, and time/date  Attention: Good  Concentration: Good  Memory: WNL  Fund of knowledge:  Good  Insight:   Good  Judgment:  Good  Impulse Control: Good   Risk Assessment: Danger to Self:  No Self-injurious Behavior: No Danger to Others: No Duty to Warn:no Physical Aggression / Violence:No  Access to Firearms a concern: No  Gang Involvement:No   Subjective: Beginning the session, patient described herself as fair and indicated that she experiences more distress when she has to do activities related to the trauma her daughter experienced and being involved with court. She expressed some concern related to her daughter and how her daughter is currently responding to trauma she experienced with her father. Patient asked for coping skills. She explored the difference between mindfulness and CBT. After practicing mindfulness and defusion she expressed less distress. She was agreeable to homework and following up. She denied suicidal  and homicidal ideation.    Interventions:  Worked on developing a therapeutic relationship with the patient using active listening and reflective statements. Provided emotional support using empathy and validation. Reviewed the treatment plan with the patient. Used summary statements. Reflected on events since the last session. Validated some of the concerns expressed by the patient. Briefly provided psychoeducation about how children respond sometimes to wanting to protect their parents. Explored goals for the session. Provided psychoeducation about mindfulness, mindful moments, defusion, and the difference between mindfulness and CBT. Practiced and processed mindfulness and defusion. Praised patient for experiencing less distress. Assisted in problem solving. Assigned homework. Provided empathic statements. Assessed for suicidal and homicidal ideation.   Homework: Implement mindfulness and defusion  Next Session: Coping skills, review homework, and emotional support.   Diagnosis: F43.10 Posttraumatic Stress disorder  Plan:   Goals Reduce overall frequency, intensity, and duration of trauma related symptoms Stabilize  trauma while increasing ability to function Enhance ability to effectively cope with triggers, reminders, flashbacks, moods, and cognitions of trauma Learn and implement coping skills that result in a reduction of trauma related symptomsl  Objectives target date for all objectives is 01/21/2023 Verbalize an understanding of the cognitive, physiological, and behavioral components of post traumatic stress disorder Describe in detail reminders of the triggers related to trauma  Learn and implement strategies to manage thoughts, behaviors, and feelings of trauma Learning and implement calming skills to reduce overall trauma Verbalize an understanding of the role that cognitive biases play in excessive irrational worry and persistent trauma symptoms Learn and implement personal  skills to manage challenging situations Learn and implement problem  solving strategies Identify and engage in pleasant activities Learn to accept limitations in life and commit to tolerating, rather than avoiding, unpleasant emotions while accomplishing meaningful goals Maintain involvement in work, family, and social activities Reestablish a consistent sleep-wake cycle Cooperate with a medical evaluation  Interventions Engage the patient in behavioral activation Use instruction, modeling, and role-playing to build the client's general social, communication, and/or conflict resolution skills Use Acceptance and Commitment Therapy to help client accept uncomfortable realities in order to accomplish value-consistent goals Support the client in following through with work, family, and social activities Teach and implement sleep hygiene practices  Refer the patient to a physician for a psychotropic medication consultation Monito the clint's psychotropic medication compliance Discuss how trauma typically involves excessive worry, various bodily expressions of tension, and avoidance of what is threatening that interact to maintain the problem  Teach the patient relaxation skills Assign the patient homework Discuss examples demonstrating that unrealistic worry overestimates the probability of threats and underestimates patient's ability  Assist the patient in analyzing his or her worries Help patient understand that avoidance is reinforcing   The patient and clinician reviewed the treatment plan on 02/21/2022. The patient approved of the treatment plan.   Conception Chancy, PsyD

## 2022-04-16 ENCOUNTER — Ambulatory Visit (INDEPENDENT_AMBULATORY_CARE_PROVIDER_SITE_OTHER): Payer: No Typology Code available for payment source | Admitting: Psychologist

## 2022-04-16 DIAGNOSIS — F4310 Post-traumatic stress disorder, unspecified: Secondary | ICD-10-CM

## 2022-04-16 NOTE — Progress Notes (Signed)
[place] Counselor/Therapist Progress Note  Patient ID: Caitlin Cain, MRN: [id number],    Date: 04/16/2022  Time Spent: 09:011 am to 9:46 am; total time: 35 minutes   This session was held via video webex teletherapy due to the coronavirus risk at this time. The patient consented to video teletherapy and was located at her home during this session. She is aware it is the responsibility of the patient to secure confidentiality on her end of the session. The provider was in a private home office for the duration of this session. Limits of confidentiality were discussed with the patient.   Treatment Type: Individual Therapy  Reported Symptoms: Trauma  Mental Status Exam: Appearance:  Well Groomed     Behavior: Appropriate  Motor: Normal  Speech/Language:  Clear and Coherent  Affect: Appropriate  Mood: normal  Thought process: normal  Thought content:   WNL  Sensory/Perceptual disturbances:   WNL  Orientation: oriented to person, place, and time/date  Attention: Good  Concentration: Good  Memory: WNL  Fund of knowledge:  Good  Insight:   Good  Judgment:  Good  Impulse Control: Good   Risk Assessment: Danger to Self:  No Self-injurious Behavior: No Danger to Others: No Duty to Warn:no Physical Aggression / Violence:No  Access to Firearms a concern: No  Gang Involvement:No   Subjective: Beginning the session, patient described herself as doing well and voiced that defusion has assisted her. From there, patient talked about her current experience with the court situation. As part of this, she described herself as experiencing some anxiety. Patient spent time exploring what this emotion may be telling her. She processed thoughts and emotions. She was agreeable to homework and following up. She denied suicidal and homicidal ideation.    Interventions:  Worked on developing a therapeutic relationship with the patient using active listening and reflective  statements. Provided emotional support using empathy and validation. Reviewed the treatment plan with the patient. Reviewed events since the last session. Praised the patient for doing well and identifying defusion as a useful tool. Explored goals for the session. Processed the court situation. Normalized and validated expressed thoughts and emotions. Used socratic questions to assist the patient. Identified the emotion of anxiety. Provided psychoeducation about emotions and how emotions can serve a purpose. Processed what purpose anxiety may be serving. Validated experience with anxiety. Validated thoughts and emotions. Identified the themes of lack of control and protection. Provided empathic statements. Processed the idea of sitting with those emotions and exploring them. Provided psychoeducation about podcast episode. Assigned homework. Provided empathic statements. Assessed for suicidal and homicidal ideation.   Homework: Sit with uncomfortable emotions and review pod cast episode  Next Session: Review homework and emotional support.   Diagnosis: F43.10 Posttraumatic Stress disorder  Plan:   Goals Reduce overall frequency, intensity, and duration of trauma related symptoms Stabilize  trauma while increasing ability to function Enhance ability to effectively cope with triggers, reminders, flashbacks, moods, and cognitions of trauma Learn and implement coping skills that result in a reduction of trauma related symptomsl  Objectives target date for all objectives is 01/21/2023 Verbalize an understanding of the cognitive, physiological, and behavioral components of post traumatic stress disorder Describe in detail reminders of the triggers related to trauma  Learn and implement strategies to manage thoughts, behaviors, and feelings of trauma Learning and implement calming skills to reduce overall trauma Verbalize an understanding of the role that cognitive biases play in excessive irrational  worry and persistent trauma  symptoms Learn and implement personal skills to manage challenging situations Learn and implement problem solving strategies Identify and engage in pleasant activities Learn to accept limitations in life and commit to tolerating, rather than avoiding, unpleasant emotions while accomplishing meaningful goals Maintain involvement in work, family, and social activities Reestablish a consistent sleep-wake cycle Cooperate with a medical evaluation  Interventions Engage the patient in behavioral activation Use instruction, modeling, and role-playing to build the client's general social, communication, and/or conflict resolution skills Use Acceptance and Commitment Therapy to help client accept uncomfortable realities in order to accomplish value-consistent goals Support the client in following through with work, family, and social activities Teach and implement sleep hygiene practices  Refer the patient to a physician for a psychotropic medication consultation Monito the clint's psychotropic medication compliance Discuss how trauma typically involves excessive worry, various bodily expressions of tension, and avoidance of what is threatening that interact to maintain the problem  Teach the patient relaxation skills Assign the patient homework Discuss examples demonstrating that unrealistic worry overestimates the probability of threats and underestimates patient's ability  Assist the patient in analyzing his or her worries Help patient understand that avoidance is reinforcing   The patient and clinician reviewed the treatment plan on 02/21/2022. The patient approved of the treatment plan.   Conception Chancy, PsyD

## 2022-05-29 ENCOUNTER — Ambulatory Visit: Payer: No Typology Code available for payment source | Admitting: Psychologist

## 2022-06-10 ENCOUNTER — Encounter (HOSPITAL_BASED_OUTPATIENT_CLINIC_OR_DEPARTMENT_OTHER): Payer: Self-pay | Admitting: Nurse Practitioner

## 2022-06-11 ENCOUNTER — Other Ambulatory Visit (HOSPITAL_COMMUNITY): Payer: Self-pay

## 2022-06-11 ENCOUNTER — Encounter (HOSPITAL_BASED_OUTPATIENT_CLINIC_OR_DEPARTMENT_OTHER): Payer: Self-pay | Admitting: Nurse Practitioner

## 2022-06-11 ENCOUNTER — Telehealth: Payer: No Typology Code available for payment source | Admitting: Physician Assistant

## 2022-06-11 DIAGNOSIS — B9689 Other specified bacterial agents as the cause of diseases classified elsewhere: Secondary | ICD-10-CM

## 2022-06-11 DIAGNOSIS — J019 Acute sinusitis, unspecified: Secondary | ICD-10-CM

## 2022-06-11 MED ORDER — AMOXICILLIN-POT CLAVULANATE 875-125 MG PO TABS
1.0000 | ORAL_TABLET | Freq: Two times a day (BID) | ORAL | 0 refills | Status: DC
  Filled 2022-06-11: qty 14, 7d supply, fill #0
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
## 2022-06-11 NOTE — Patient Instructions (Signed)
Caitlin Cain, thank you for joining Mar Daring, [name] for today's virtual visit.  While this provider is not your primary care provider (PCP), if your PCP is located in our provider database this encounter information will be shared with them immediately following your visit.  Consent: [name] provided verbal consent for this virtual visit at the beginning of the encounter.  Current Medications:  Current Outpatient Medications:    amoxicillin-clavulanate (AUGMENTIN) 875-125 MG tablet, Take 1 tablet by mouth 2 (two) times daily., Disp: 14 tablet, Rfl: 0   albuterol (VENTOLIN HFA) 108 (90 Base) MCG/ACT inhaler, Inhale 1-2 puffs into the lungs every 4 (four) hours as needed for wheezing or shortness of breath., Disp: 6.7 g, Rfl: 1   amphetamine-dextroamphetamine (ADDERALL) 20 MG tablet, Take 1 tablet by mouth twice a day as directed, Disp: 60 tablet, Rfl: 0   amphetamine-dextroamphetamine (ADDERALL) 20 MG tablet, Take 1 tablet by mouth twice a day as directed., Disp: 60 tablet, Rfl: 0   amphetamine-dextroamphetamine (ADDERALL) 20 MG tablet, Take 1 tablet by mouth twice a day as directed, Disp: 60 tablet, Rfl: 0   amphetamine-dextroamphetamine (ADDERALL) 20 MG tablet, Take 1 tablet by mouth twice a day as directed, Disp: 60 tablet, Rfl: 0   amphetamine-dextroamphetamine (ADDERALL) 20 MG tablet, Take 1 tablet by mouth twice a day as directed. May fill 11/22/2021, Disp: 60 tablet, Rfl: 0   amphetamine-dextroamphetamine (ADDERALL) 20 MG tablet, Take 1 tablet by mouth twice a day as directed., Disp: 60 tablet, Rfl: 0   amphetamine-dextroamphetamine (ADDERALL) 20 MG tablet, Take 1 tablet by mouth twice a day as directed, Disp: 60 tablet, Rfl: 0   amphetamine-dextroamphetamine (ADDERALL) 20 MG tablet, Take 1 tablet by mouth twice a day as directed, Disp: 60 tablet, Rfl: 0   budesonide (RHINOCORT AQUA) 32 MCG/ACT nasal spray, Place 2 sprays into both nostrils daily.,  Disp: 8.43 mL, Rfl: 6   celecoxib (CELEBREX) 200 MG capsule, Take 1 capsule by mouth daily if needed. Take with a meal., Disp: 30 capsule, Rfl: 1   chlorpheniramine-HYDROcodone 10-8 MG/5ML, Take 5 mLs by mouth at bedtime as needed for cough (will cause drowsiness)., Disp: 70 mL, Rfl: 0   fluconazole (DIFLUCAN) 150 MG tablet, Take 1 tablet by mouth at the first sign of symptoms of yeast. If no resolution, repeat dose in 72 hours., Disp: 2 tablet, Rfl: 2   hydrOXYzine (ATARAX) 25 MG tablet, Take 1/2 to 2 tablet by mouth twice a day as needed, Disp: 90 tablet, Rfl: 1   hydrOXYzine (ATARAX) 25 MG tablet, Take 1/2 to 2 tablet by mouth twice a day as needed, Disp: 90 tablet, Rfl: 2   hydrOXYzine (ATARAX) 25 MG tablet, Take 1/2 to 2 tablet by mouth twice a day as needed, Disp: 90 tablet, Rfl: 0   hydrOXYzine (ATARAX/VISTARIL) 25 MG tablet, Take 1/2 to 2 tablets by mouth twice a day as needed, Disp: 90 tablet, Rfl: 1   hydrOXYzine (ATARAX/VISTARIL) 25 MG tablet, Take 1/2 to 2 tablets by mouth twice a day as needed, Disp: 90 tablet, Rfl: 1   hydrOXYzine (ATARAX/VISTARIL) 25 MG tablet, Take 1/2 to 2 tablets by mouth twice a day as needed, Disp: 90 tablet, Rfl: 1   levocetirizine (XYZAL) 5 MG tablet, Take 1 tablet (5 mg total) by mouth every evening., Disp: 30 tablet, Rfl: 11   meloxicam (MOBIC) 7.5 MG tablet, Take 1 tablet by mouth daily with a meal as needed, Disp: 30 tablet, Rfl: 1  OVER THE COUNTER MEDICATION, Multiple Vitamin-Gummie-Take 1 by mouth daily., Disp: , Rfl:    voriconazole (VFEND) 200 MG tablet, Take 1 tablet (200 mg total) by mouth 2 (two) times daily., Disp: 14 tablet, Rfl: 1   Medications ordered in this encounter:  Meds ordered this encounter  Medications   amoxicillin-clavulanate (AUGMENTIN) 875-125 MG tablet    Sig: Take 1 tablet by mouth 2 (two) times daily.    Dispense:  14 tablet    Refill:  0    Order Specific Question:   Supervising Provider    Answer:   Sabra Heck, BRIAN [[id number]]      *If you need refills on other medications prior to your next appointment, please contact your pharmacy*  Follow-Up: Call back or seek an in-person evaluation if the symptoms worsen or if the condition fails to improve as anticipated.  Other Instructions Sinus Infection, Adult A sinus infection, also called sinusitis, is inflammation of your sinuses. Sinuses are hollow spaces in the bones around your face. Your sinuses are located: Around your eyes. In the middle of your forehead. Behind your nose. In your cheekbones. Mucus normally drains out of your sinuses. When your nasal tissues become inflamed or swollen, mucus can become trapped or blocked. This allows bacteria, viruses, and fungi to grow, which leads to infection. Most infections of the sinuses are caused by a virus. A sinus infection can develop quickly. It can last for up to 4 weeks (acute) or for more than 12 weeks (chronic). A sinus infection often develops after a cold. What are the causes? This condition is caused by anything that creates swelling in the sinuses or stops mucus from draining. This includes: Allergies. Asthma. Infection from bacteria or viruses. Deformities or blockages in your nose or sinuses. Abnormal growths in the nose (nasal polyps). Pollutants, such as chemicals or irritants in the air. Infection from fungi. This is rare. What increases the risk? You are more likely to develop this condition if you: Have a weak body defense system (immune system). Do a lot of swimming or diving. Overuse nasal sprays. Smoke. What are the signs or symptoms? The main symptoms of this condition are pain and a feeling of pressure around the affected sinuses. Other symptoms include: Stuffy nose or congestion that makes it difficult to breathe through your nose. Thick yellow or greenish drainage from your nose. Tenderness, swelling, and warmth over the affected sinuses. A cough that may get worse at  night. Decreased sense of smell and taste. Extra mucus that collects in the throat or the back of the nose (postnasal drip) causing a sore throat or bad breath. Tiredness (fatigue). Fever. How is this diagnosed? This condition is diagnosed based on: Your symptoms. Your medical history. A physical exam. Tests to find out if your condition is acute or chronic. This may include: Checking your nose for nasal polyps. Viewing your sinuses using a device that has a light (endoscope). Testing for allergies or bacteria. Imaging tests, such as an MRI or CT scan. In rare cases, a bone biopsy may be done to rule out more serious types of fungal sinus disease. How is this treated? Treatment for a sinus infection depends on the cause and whether your condition is chronic or acute. If caused by a virus, your symptoms should go away on their own within 10 days. You may be given medicines to relieve symptoms. They include: Medicines that shrink swollen nasal passages (decongestants). A spray that eases inflammation of the nostrils (topical intranasal  corticosteroids). Rinses that help get rid of thick mucus in your nose (nasal saline washes). Medicines that treat allergies (antihistamines). Over-the-counter pain relievers. If caused by bacteria, your health care provider may recommend waiting to see if your symptoms improve. Most bacterial infections will get better without antibiotic medicine. You may be given antibiotics if you have: A severe infection. A weak immune system. If caused by narrow nasal passages or nasal polyps, surgery may be needed. Follow these instructions at home: Medicines Take, use, or apply over-the-counter and prescription medicines only as told by your health care provider. These may include nasal sprays. If you were prescribed an antibiotic medicine, take it as told by your health care provider. Do not stop taking the antibiotic even if you start to feel better. Hydrate and  humidify  Drink enough fluid to keep your urine pale yellow. Staying hydrated will help to thin your mucus. Use a cool mist humidifier to keep the humidity level in your home above 50%. Inhale steam for 10-15 minutes, 3-4 times a day, or as told by your health care provider. You can do this in the bathroom while a hot shower is running. Limit your exposure to cool or dry air. Rest Rest as much as possible. Sleep with your head raised (elevated). Make sure you get enough sleep each night. General instructions  Apply a warm, moist washcloth to your face 3-4 times a day or as told by your health care provider. This will help with discomfort. Use nasal saline washes as often as told by your health care provider. Wash your hands often with soap and water to reduce your exposure to germs. If soap and water are not available, use hand sanitizer. Do not smoke. Avoid being around people who are smoking (secondhand smoke). Keep all follow-up visits. This is important. Contact a health care provider if: You have a fever. Your symptoms get worse. Your symptoms do not improve within 10 days. Get help right away if: You have a severe headache. You have persistent vomiting. You have severe pain or swelling around your face or eyes. You have vision problems. You develop confusion. Your neck is stiff. You have trouble breathing. These symptoms may be an emergency. Get help right away. Call 911. Do not wait to see if the symptoms will go away. Do not drive yourself to the hospital. Summary A sinus infection is soreness and inflammation of your sinuses. Sinuses are hollow spaces in the bones around your face. This condition is caused by nasal tissues that become inflamed or swollen. The swelling traps or blocks the flow of mucus. This allows bacteria, viruses, and fungi to grow, which leads to infection. If you were prescribed an antibiotic medicine, take it as told by your health care provider. Do  not stop taking the antibiotic even if you start to feel better. Keep all follow-up visits. This is important. This information is not intended to replace advice given to you by your health care provider. Make sure you discuss any questions you have with your health care provider. Document Revised: 08/27/2021 Document Reviewed: 08/27/2021 Elsevier Patient Education  [place].    If you have been instructed to have an in-person evaluation today at a local Urgent Care facility, please use the link below. It will take you to a list of all of our available [facility], including address, phone number and hours of operation. Please do not delay care.  [facility]  If you or  a family member do not have a primary care provider, use the link below to schedule a visit and establish care. When you choose a [facility] primary care physician or advanced practice provider, you gain a long-term partner in health. Find a Primary Care Provider  Learn more about [facility] in-office and virtual care options: [facility]

## 2022-06-12 ENCOUNTER — Ambulatory Visit (HOSPITAL_BASED_OUTPATIENT_CLINIC_OR_DEPARTMENT_OTHER): Payer: No Typology Code available for payment source | Admitting: Nurse Practitioner

## 2022-06-12 NOTE — Telephone Encounter (Signed)
Pt completed a on demand appt

## 2022-07-01 ENCOUNTER — Ambulatory Visit (INDEPENDENT_AMBULATORY_CARE_PROVIDER_SITE_OTHER): Payer: No Typology Code available for payment source | Admitting: Psychologist

## 2022-07-01 DIAGNOSIS — F4310 Post-traumatic stress disorder, unspecified: Secondary | ICD-10-CM

## 2022-07-01 NOTE — Progress Notes (Signed)
[facility] Counselor/Therapist Progress Note  Patient ID: Caitlin Cain, MRN: [id number],    Date: 07/01/2022  Time Spent: 01:07 pm to 01:27 pm; total time: 20 minutes   This session was held via video webex teletherapy due to the coronavirus risk at this time. The patient consented to video teletherapy and was located at her home during this session. She is aware it is the responsibility of the patient to secure confidentiality on her end of the session. The provider was in a private home office for the duration of this session. Limits of confidentiality were discussed with the patient.   Treatment Type: Individual Therapy  Reported Symptoms: Stress over possibly losing job.  Mental Status Exam: Appearance:  Well Groomed     Behavior: Appropriate  Motor: Normal  Speech/Language:  Clear and Coherent  Affect: Appropriate  Mood: normal  Thought process: normal  Thought content:   WNL  Sensory/Perceptual disturbances:   WNL  Orientation: oriented to person, place, and time/date  Attention: Good  Concentration: Good  Memory: WNL  Fund of knowledge:  Good  Insight:   Good  Judgment:  Good  Impulse Control: Good   Risk Assessment: Danger to Self:  No Self-injurious Behavior: No Danger to Others: No Duty to Warn:no Physical Aggression / Violence:No  Access to Firearms a concern: No  Gang Involvement:No   Subjective: Beginning the session, patient described herself as doing fair while reflecting on events since the last session. She voiced that she is waiting to see if she will have a job come the end of the week. She processed thoughts and emotions. She indicated that she wanted to wait to schedule until she had better clarity regarding job security. She denied suicidal and homicidal ideation.    Interventions:  Worked on developing a therapeutic relationship with the patient using active listening and reflective statements. Provided emotional support using  empathy and validation. Reviewed the treatment plan with the patient. Used summary and reflective statements. Normalized and validated expressed thoughts and emotions. Used socratic questions. Processed the thoughts expressed. Explored what coping skills patient was using. Discussed next steps moving forward for counseling. Provided empathic statements. Assessed for suicidal and homicidal ideation.   Homework: NA  Next Session: NA  Diagnosis: F43.10 Posttraumatic Stress disorder  Plan:   Goals Reduce overall frequency, intensity, and duration of trauma related symptoms Stabilize  trauma while increasing ability to function Enhance ability to effectively cope with triggers, reminders, flashbacks, moods, and cognitions of trauma Learn and implement coping skills that result in a reduction of trauma related symptomsl  Objectives target date for all objectives is 01/21/2023 Verbalize an understanding of the cognitive, physiological, and behavioral components of post traumatic stress disorder Describe in detail reminders of the triggers related to trauma  Learn and implement strategies to manage thoughts, behaviors, and feelings of trauma Learning and implement calming skills to reduce overall trauma Verbalize an understanding of the role that cognitive biases play in excessive irrational worry and persistent trauma symptoms Learn and implement personal skills to manage challenging situations Learn and implement problem solving strategies Identify and engage in pleasant activities Learn to accept limitations in life and commit to tolerating, rather than avoiding, unpleasant emotions while accomplishing meaningful goals Maintain involvement in work, family, and social activities Reestablish a consistent sleep-wake cycle Cooperate with a medical evaluation  Interventions Engage the patient in behavioral activation Use instruction, modeling, and role-playing to build the client's general  social, communication, and/or conflict resolution skills  Use Acceptance and Commitment Therapy to help client accept uncomfortable realities in order to accomplish value-consistent goals Support the client in following through with work, family, and social activities Teach and implement sleep hygiene practices  Refer the patient to a physician for a psychotropic medication consultation Monito the clint's psychotropic medication compliance Discuss how trauma typically involves excessive worry, various bodily expressions of tension, and avoidance of what is threatening that interact to maintain the problem  Teach the patient relaxation skills Assign the patient homework Discuss examples demonstrating that unrealistic worry overestimates the probability of threats and underestimates patient's ability  Assist the patient in analyzing his or her worries Help patient understand that avoidance is reinforcing   The patient and clinician reviewed the treatment plan on 02/21/2022. The patient approved of the treatment plan.   Conception Chancy, PsyD

## 2022-07-24 ENCOUNTER — Telehealth: Payer: No Typology Code available for payment source | Admitting: Physician Assistant

## 2022-07-24 DIAGNOSIS — R051 Acute cough: Secondary | ICD-10-CM

## 2022-07-24 MED ORDER — BENZONATATE 100 MG PO CAPS: 100.0000 mg | ORAL_CAPSULE | Freq: Three times a day (TID) | ORAL | 0 refills | Status: AC

## 2022-07-24 MED ORDER — DOXYCYCLINE HYCLATE 100 MG PO CAPS: 100.0000 mg | ORAL_CAPSULE | Freq: Two times a day (BID) | ORAL | 0 refills | Status: AC
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
## 2022-07-24 NOTE — Patient Instructions (Addendum)
Caitlin Cain, thank you for joining [name], NP for today's virtual visit.  While this provider is not your primary care provider (PCP), if your PCP is located in our provider database this encounter information will be shared with them immediately following your visit.   [place] account gives you access to today's visit and all your visits, tests, and labs performed at [facility] " click here if you don't have a [facility] account or go to [email or web address]  Consent: [name] provided verbal consent for this virtual visit at the beginning of the encounter.  Current Medications:  Current Outpatient Medications:    albuterol (VENTOLIN HFA) 108 (90 Base) MCG/ACT inhaler, Inhale 1-2 puffs into the lungs every 4 (four) hours as needed for wheezing or shortness of breath., Disp: 6.7 g, Rfl: 1   amoxicillin-clavulanate (AUGMENTIN) 875-125 MG tablet, Take 1 tablet by mouth 2 (two) times daily., Disp: 14 tablet, Rfl: 0   amphetamine-dextroamphetamine (ADDERALL) 20 MG tablet, Take 1 tablet by mouth twice a day as directed, Disp: 60 tablet, Rfl: 0   amphetamine-dextroamphetamine (ADDERALL) 20 MG tablet, Take 1 tablet by mouth twice a day as directed., Disp: 60 tablet, Rfl: 0   amphetamine-dextroamphetamine (ADDERALL) 20 MG tablet, Take 1 tablet by mouth twice a day as directed, Disp: 60 tablet, Rfl: 0   amphetamine-dextroamphetamine (ADDERALL) 20 MG tablet, Take 1 tablet by mouth twice a day as directed, Disp: 60 tablet, Rfl: 0   amphetamine-dextroamphetamine (ADDERALL) 20 MG tablet, Take 1 tablet by mouth twice a day as directed. May fill 11/22/2021, Disp: 60 tablet, Rfl: 0   amphetamine-dextroamphetamine (ADDERALL) 20 MG tablet, Take 1 tablet by mouth twice a day as directed., Disp: 60 tablet, Rfl: 0   amphetamine-dextroamphetamine (ADDERALL) 20 MG tablet, Take 1 tablet by mouth twice a day as directed, Disp: 60 tablet, Rfl:  0   amphetamine-dextroamphetamine (ADDERALL) 20 MG tablet, Take 1 tablet by mouth twice a day as directed, Disp: 60 tablet, Rfl: 0   budesonide (RHINOCORT AQUA) 32 MCG/ACT nasal spray, Place 2 sprays into both nostrils daily., Disp: 8.43 mL, Rfl: 6   celecoxib (CELEBREX) 200 MG capsule, Take 1 capsule by mouth daily if needed. Take with a meal., Disp: 30 capsule, Rfl: 1   chlorpheniramine-HYDROcodone 10-8 MG/5ML, Take 5 mLs by mouth at bedtime as needed for cough (will cause drowsiness)., Disp: 70 mL, Rfl: 0   fluconazole (DIFLUCAN) 150 MG tablet, Take 1 tablet by mouth at the first sign of symptoms of yeast. If no resolution, repeat dose in 72 hours., Disp: 2 tablet, Rfl: 2   hydrOXYzine (ATARAX) 25 MG tablet, Take 1/2 to 2 tablet by mouth twice a day as needed, Disp: 90 tablet, Rfl: 1   hydrOXYzine (ATARAX) 25 MG tablet, Take 1/2 to 2 tablet by mouth twice a day as needed, Disp: 90 tablet, Rfl: 2   hydrOXYzine (ATARAX) 25 MG tablet, Take 1/2 to 2 tablet by mouth twice a day as needed, Disp: 90 tablet, Rfl: 0   hydrOXYzine (ATARAX/VISTARIL) 25 MG tablet, Take 1/2 to 2 tablets by mouth twice a day as needed, Disp: 90 tablet, Rfl: 1   hydrOXYzine (ATARAX/VISTARIL) 25 MG tablet, Take 1/2 to 2 tablets by mouth twice a day as needed, Disp: 90 tablet, Rfl: 1   hydrOXYzine (ATARAX/VISTARIL) 25 MG tablet, Take 1/2 to 2 tablets by mouth twice a day as needed, Disp: 90 tablet, Rfl: 1   levocetirizine (XYZAL) 5 MG  tablet, Take 1 tablet (5 mg total) by mouth every evening., Disp: 30 tablet, Rfl: 11   meloxicam (MOBIC) 7.5 MG tablet, Take 1 tablet by mouth daily with a meal as needed, Disp: 30 tablet, Rfl: 1   OVER THE COUNTER MEDICATION, Multiple Vitamin-Gummie-Take 1 by mouth daily., Disp: , Rfl:    voriconazole (VFEND) 200 MG tablet, Take 1 tablet (200 mg total) by mouth 2 (two) times daily., Disp: 14 tablet, Rfl: 1   Medications ordered in this encounter:  No orders of the defined types were placed in this  encounter.    *If you need refills on other medications prior to your next appointment, please contact your pharmacy*  Follow-Up: Call back or seek an in-person evaluation if the symptoms worsen or if the condition fails to improve as anticipated.  [facility] [phone]  Other Instructions You were given a prescription for antibiotics. Please take the antibiotic prescription fully.   Follow up with your regular doctor in 1 week for reassessment and seek care sooner if your symptoms worsen or fail to improve.   If you have been instructed to have an in-person evaluation today at a local Urgent Care facility, please use the link below. It will take you to a list of all of our available [facility], including address, phone number and hours of operation. Please do not delay care.  [facility]  If you or a family member do not have a primary care provider, use the link below to schedule a visit and establish care. When you choose a [facility] primary care physician or advanced practice provider, you gain a long-term partner in health. Find a Primary Care Provider  Learn more about [facility] in-office and virtual care options: [facility]

## 2022-10-04 IMAGING — MG DIGITAL SCREENING BREAST BILAT IMPLANT W/ TOMO W/ CAD
8 of 12 series · 8 of 28 positions shown · non-contrast
Comparison: None.
COMPARISON: None.

Addendum:
CLINICAL DATA: Screening.

EXAM:
DIGITAL SCREENING BILATERAL MAMMOGRAM WITH IMPLANTS, CAD AND
TOMOSYNTHESIS
TECHNIQUE: Bilateral screening digital craniocaudal and mediolateral oblique
mammograms were obtained. Bilateral screening digital breast
tomosynthesis was performed. The images were evaluated with
computer-aided detection. Standard and/or implant displaced views
were performed.

[R CC]
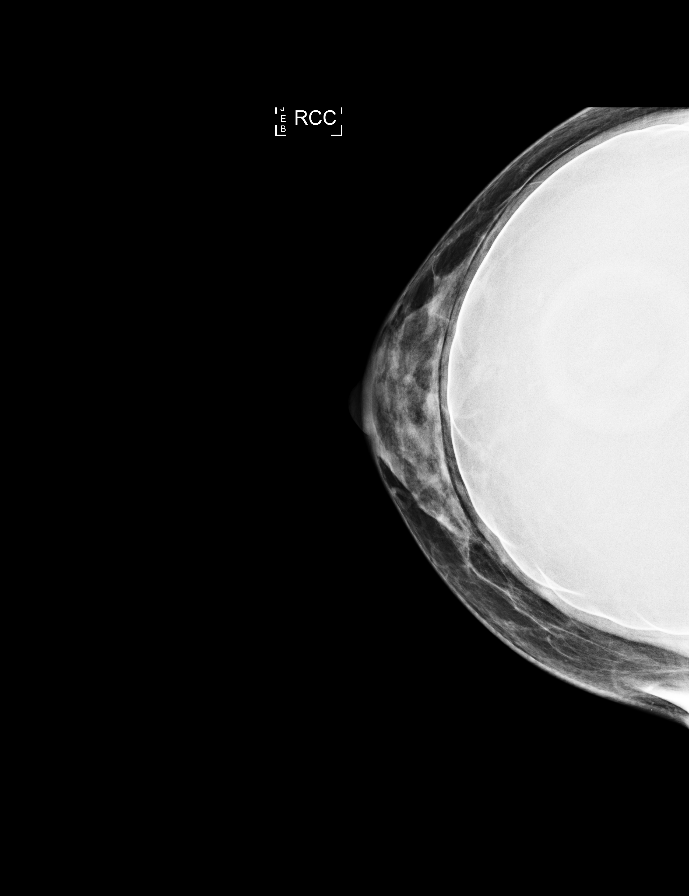

[R MLO]
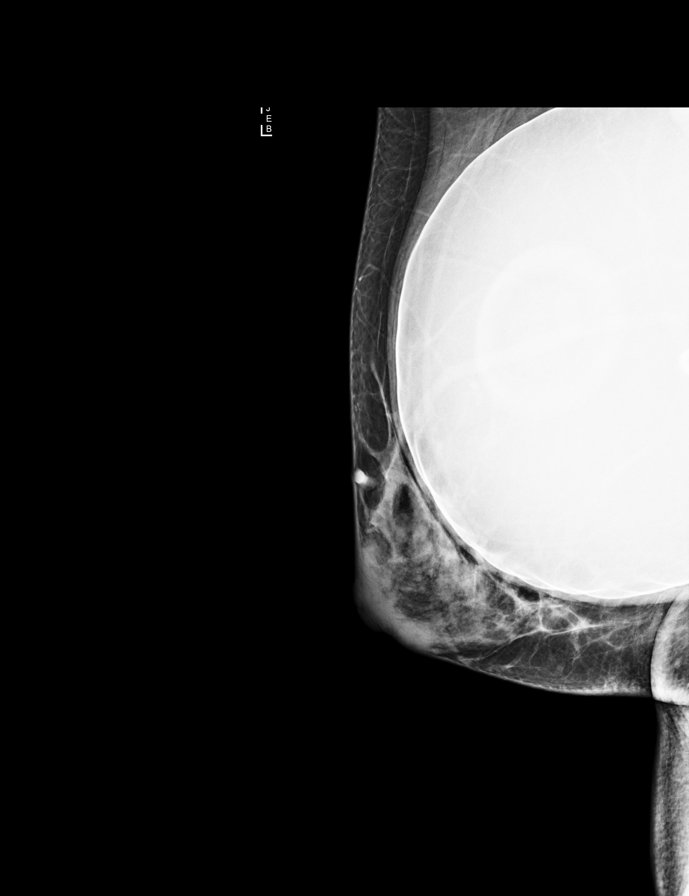

[L CC]
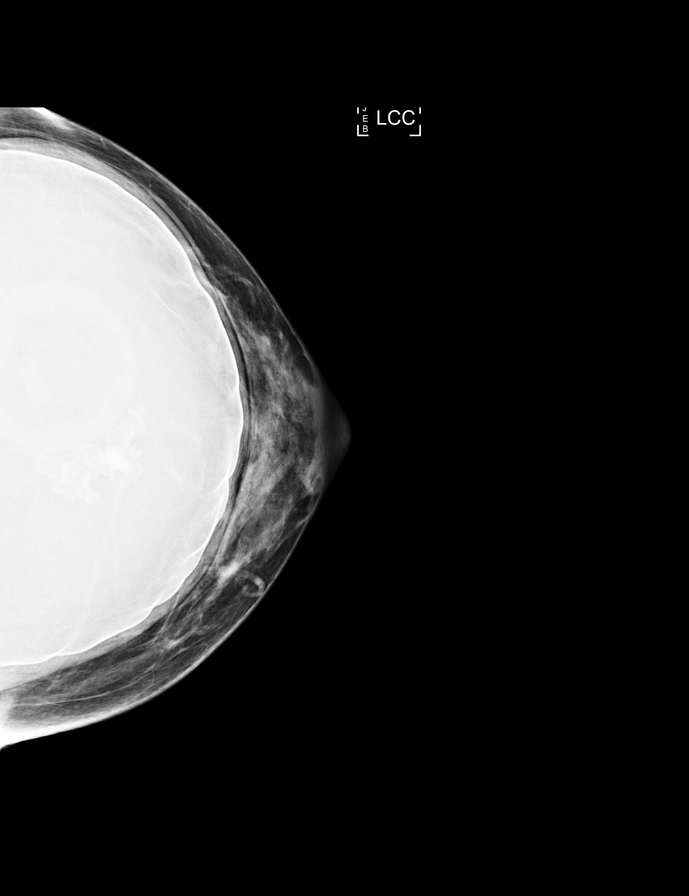

[L MLO]
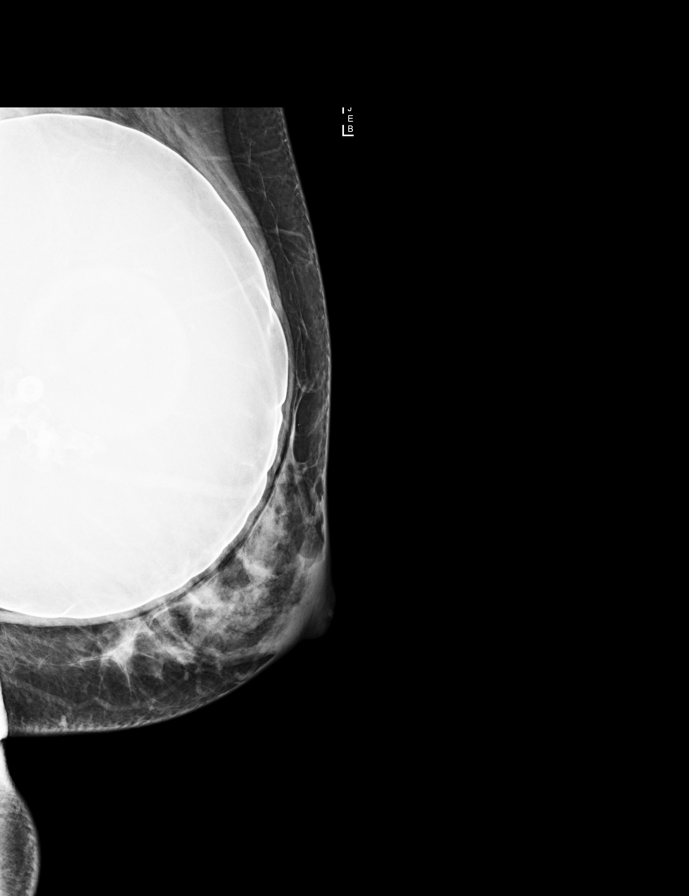

[R CC synth-2D]
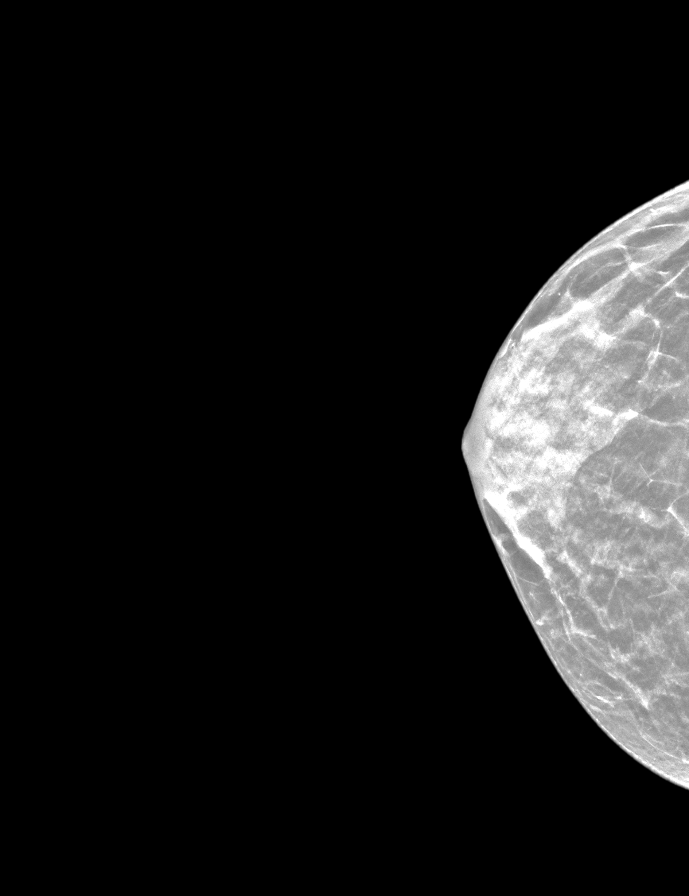

[L MLO synth-2D]
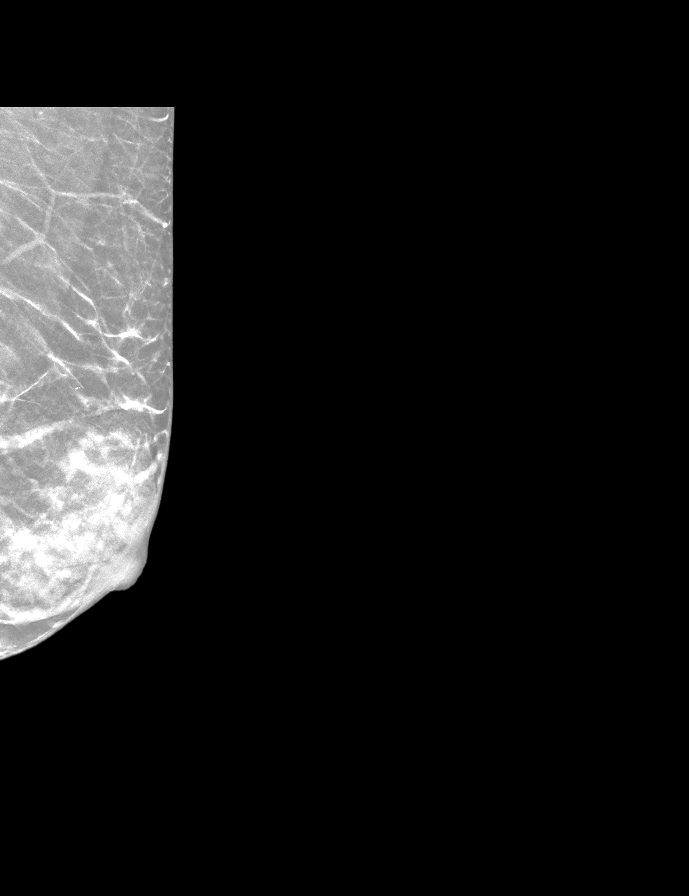

[R MLO synth-2D]
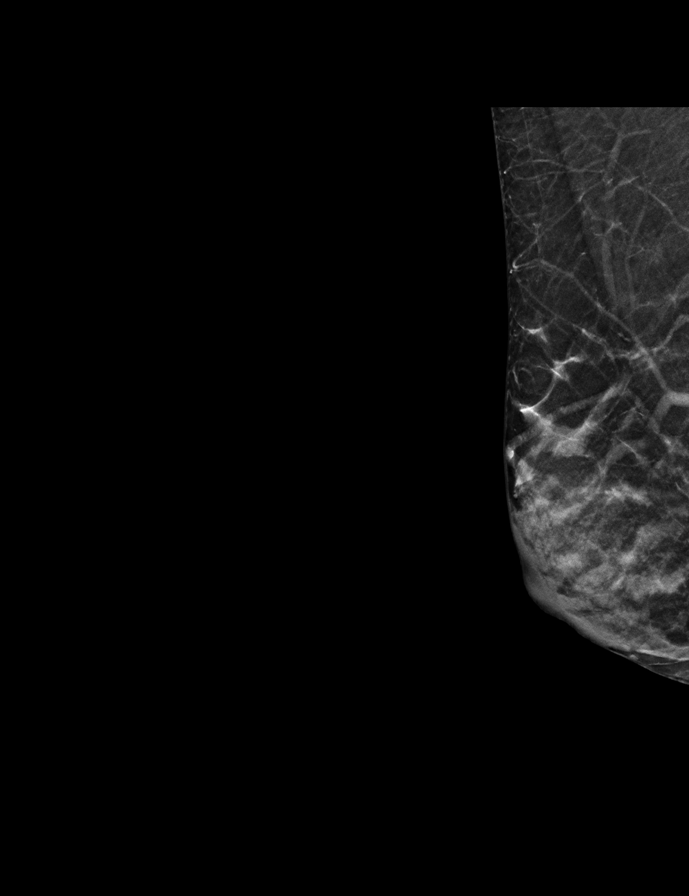

[L CC synth-2D]
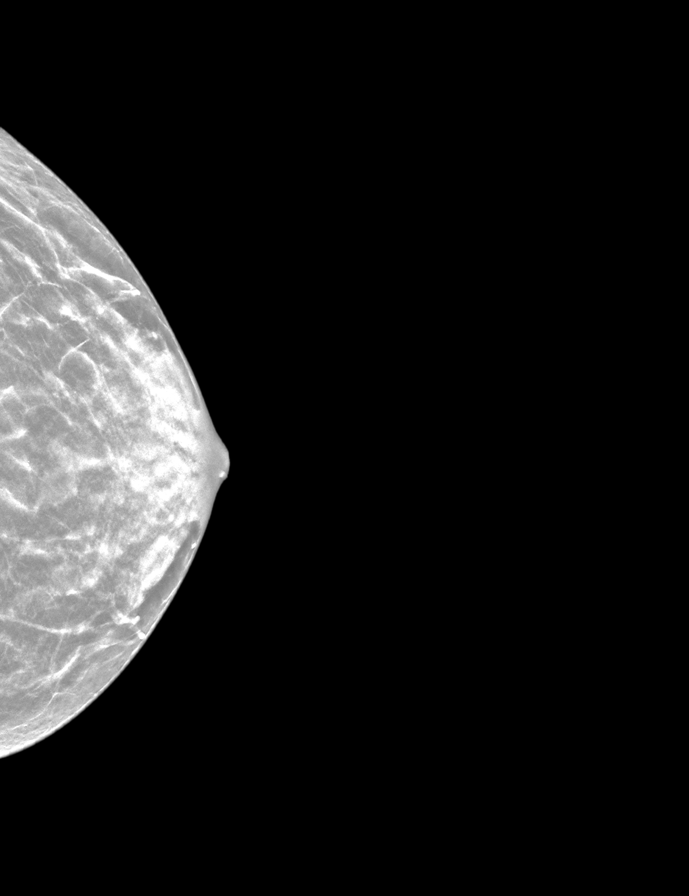

[8 of 28 positions shown; findings below may reference images not displayed]

ACR Breast Density Category c: The breast tissue is heterogeneously
dense, which may obscure small masses.
FINDINGS: The patient has retropectoral implants.

In the left breast, a possible asymmetry warrants further
evaluation. In the right breast, no suspicious masses or malignant
type calcifications are identified.
IMPRESSION: Further evaluation is suggested for possible asymmetry in the left
breast.

RECOMMENDATION:
Diagnostic mammogram and possibly ultrasound of the left breast.
(Code:3X-H-XXF)

The patient will be contacted regarding the findings, and additional
imaging will be scheduled.

BI-RADS CATEGORY  0: Incomplete. Need additional imaging evaluation
and/or prior mammograms for comparison.

ADDENDUM:
A prior mammogram from 4457 is available. The questioned asymmetry
in the left breast seen on MLO view is not visualized on the prior
study.

Recommendation remains the same: Diagnostic mammogram and possible
ultrasound of the left breast.

BI-RADS category: 0-incomplete.

*** End of Addendum ***
ACR Breast Density Category c: The breast tissue is heterogeneously
dense, which may obscure small masses.
FINDINGS: The patient has retropectoral implants.

In the left breast, a possible asymmetry warrants further
evaluation. In the right breast, no suspicious masses or malignant
type calcifications are identified.
IMPRESSION: Further evaluation is suggested for possible asymmetry in the left
breast.

RECOMMENDATION:
Diagnostic mammogram and possibly ultrasound of the left breast.
(Code:3X-H-XXF)

The patient will be contacted regarding the findings, and additional
imaging will be scheduled.

BI-RADS CATEGORY  0: Incomplete. Need additional imaging evaluation
and/or prior mammograms for comparison.

## 2022-11-07 ENCOUNTER — Telehealth: Payer: 59 | Admitting: Family Medicine

## 2022-11-07 DIAGNOSIS — U071 COVID-19: Secondary | ICD-10-CM

## 2022-11-07 MED ORDER — MOLNUPIRAVIR EUA 200MG CAPSULE: 4.0000 | ORAL_CAPSULE | Freq: Two times a day (BID) | ORAL | 0 refills | Status: AC

## 2022-11-07 MED ORDER — PSEUDOEPH-BROMPHEN-DM 30-2-10 MG/5ML PO SYRP: 5.0000 mL | ORAL_SOLUTION | Freq: Four times a day (QID) | ORAL | 0 refills | Status: DC | PRN
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
## 2022-11-07 NOTE — Patient Instructions (Addendum)
[name], thank you for joining [name], NP for today's virtual visit.  While this provider is not your primary care provider (PCP), if your PCP is located in our provider database this encounter information will be shared with them immediately following your visit.   [facility] account gives you access to today's visit and all your visits, tests, and labs performed at [facility] " click here if you don't have a [facility] account or go to [email or web address]  Consent: (Patient) Kimiyo Carmicheal provided verbal consent for this virtual visit at the beginning of the encounter.  Current Medications:  Current Outpatient Medications:    molnupiravir EUA (LAGEVRIO) 200 mg CAPS capsule, Take 4 capsules (800 mg total) by mouth 2 (two) times daily for 5 days., Disp: 40 capsule, Rfl: 0   albuterol (VENTOLIN HFA) 108 (90 Base) MCG/ACT inhaler, Inhale 1-2 puffs into the lungs every 4 (four) hours as needed for wheezing or shortness of breath., Disp: 6.7 g, Rfl: 1   amoxicillin-clavulanate (AUGMENTIN) 875-125 MG tablet, Take 1 tablet by mouth 2 (two) times daily., Disp: 14 tablet, Rfl: 0   amphetamine-dextroamphetamine (ADDERALL) 20 MG tablet, Take 1 tablet by mouth twice a day as directed, Disp: 60 tablet, Rfl: 0   amphetamine-dextroamphetamine (ADDERALL) 20 MG tablet, Take 1 tablet by mouth twice a day as directed., Disp: 60 tablet, Rfl: 0   amphetamine-dextroamphetamine (ADDERALL) 20 MG tablet, Take 1 tablet by mouth twice a day as directed, Disp: 60 tablet, Rfl: 0   amphetamine-dextroamphetamine (ADDERALL) 20 MG tablet, Take 1 tablet by mouth twice a day as directed, Disp: 60 tablet, Rfl: 0   amphetamine-dextroamphetamine (ADDERALL) 20 MG tablet, Take 1 tablet by mouth twice a day as directed. May fill 11/22/2021, Disp: 60 tablet, Rfl: 0   amphetamine-dextroamphetamine (ADDERALL) 20 MG tablet, Take 1 tablet by mouth twice a day as directed.,  Disp: 60 tablet, Rfl: 0   amphetamine-dextroamphetamine (ADDERALL) 20 MG tablet, Take 1 tablet by mouth twice a day as directed, Disp: 60 tablet, Rfl: 0   amphetamine-dextroamphetamine (ADDERALL) 20 MG tablet, Take 1 tablet by mouth twice a day as directed, Disp: 60 tablet, Rfl: 0   brompheniramine-pseudoephedrine-DM 30-2-10 MG/5ML syrup, Take 5 mLs by mouth 4 (four) times daily as needed., Disp: 120 mL, Rfl: 0   budesonide (RHINOCORT AQUA) 32 MCG/ACT nasal spray, Place 2 sprays into both nostrils daily., Disp: 8.43 mL, Rfl: 6   celecoxib (CELEBREX) 200 MG capsule, Take 1 capsule by mouth daily if needed. Take with a meal., Disp: 30 capsule, Rfl: 1   fluconazole (DIFLUCAN) 150 MG tablet, Take 1 tablet by mouth at the first sign of symptoms of yeast. If no resolution, repeat dose in 72 hours., Disp: 2 tablet, Rfl: 2   hydrOXYzine (ATARAX) 25 MG tablet, Take 1/2 to 2 tablet by mouth twice a day as needed, Disp: 90 tablet, Rfl: 1   hydrOXYzine (ATARAX) 25 MG tablet, Take 1/2 to 2 tablet by mouth twice a day as needed, Disp: 90 tablet, Rfl: 2   hydrOXYzine (ATARAX) 25 MG tablet, Take 1/2 to 2 tablet by mouth twice a day as needed, Disp: 90 tablet, Rfl: 0   hydrOXYzine (ATARAX/VISTARIL) 25 MG tablet, Take 1/2 to 2 tablets by mouth twice a day as needed, Disp: 90 tablet, Rfl: 1   hydrOXYzine (ATARAX/VISTARIL) 25 MG tablet, Take 1/2 to 2 tablets by mouth twice a day as needed, Disp: 90 tablet, Rfl: 1   hydrOXYzine (  ATARAX/VISTARIL) 25 MG tablet, Take 1/2 to 2 tablets by mouth twice a day as needed, Disp: 90 tablet, Rfl: 1   levocetirizine (XYZAL) 5 MG tablet, Take 1 tablet (5 mg total) by mouth every evening., Disp: 30 tablet, Rfl: 11   meloxicam (MOBIC) 7.5 MG tablet, Take 1 tablet by mouth daily with a meal as needed, Disp: 30 tablet, Rfl: 1   OVER THE COUNTER MEDICATION, Multiple Vitamin-Gummie-Take 1 by mouth daily., Disp: , Rfl:    voriconazole (VFEND) 200 MG tablet, Take 1 tablet (200 mg total) by  mouth 2 (two) times daily., Disp: 14 tablet, Rfl: 1   Medications ordered in this encounter:  Meds ordered this encounter  Medications   molnupiravir EUA (LAGEVRIO) 200 mg CAPS capsule    Sig: Take 4 capsules (800 mg total) by mouth 2 (two) times daily for 5 days.    Dispense:  40 capsule    Refill:  0    Order Specific Question:   Supervising Provider    Answer:   Chase Picket [id number]   brompheniramine-pseudoephedrine-DM 30-2-10 MG/5ML syrup    Sig: Take 5 mLs by mouth 4 (four) times daily as needed.    Dispense:  120 mL    Refill:  0    Order Specific Question:   Supervising Provider    Answer:   Chase Picket [[id number]]     *If you need refills on other medications prior to your next appointment, please contact your pharmacy*  Follow-Up: Call back or seek an in-person evaluation if the symptoms worsen or if the condition fails to improve as anticipated.  [facility] [phone]  Care Instructions:  - Continue OTC symptomatic management of choice - Push fluids - Rest as needed  Isolation Instructions: You are to isolate at home for 5 days from onset of your symptoms. If you must be around other household members who do not have symptoms, you need to make sure that both you and the family members are masking consistently with a high-quality mask.  After day 5 of isolation, if you have had no fever within 24 hours and you are feeling better, you can end isolation but need to mask for an additional 5 days.  After day 5 if you have a fever or are having significant symptoms, please isolate for full 10 days.  If you note any worsening of symptoms despite treatment, please seek an in-person evaluation ASAP. If you note any significant shortness of breath or any chest pain, please seek ER evaluation. Please do not delay care!   COVID-19: What to Do if You Are Sick If you test positive and are an older adult or someone who is at high risk of getting very  sick from COVID-19, treatment may be available. Contact a healthcare provider right away after a positive test to determine if you are eligible, even if your symptoms are mild right now. You can also visit a Test to Treat location and, if eligible, receive a prescription from a provider. Don't delay: Treatment must be started within the first few days to be effective. If you have a fever, cough, or other symptoms, you might have COVID-19. Most people have mild illness and are able to recover at home. If you are sick: Keep track of your symptoms. If you have an emergency warning sign (including trouble breathing), call 911. Steps to help prevent the spread of COVID-19 if you are sick If you are sick with COVID-19 or  think you might have COVID-19, follow the steps below to care for yourself and to help protect other people in your home and community. Stay home except to get medical care Stay home. Most people with COVID-19 have mild illness and can recover at home without medical care. Do not leave your home, except to get medical care. Do not visit public areas and do not go to places where you are unable to wear a mask. Take care of yourself. Get rest and stay hydrated. Take over-the-counter medicines, such as acetaminophen, to help you feel better. Stay in touch with your doctor. Call before you get medical care. Be sure to get care if you have trouble breathing, or have any other emergency warning signs, or if you think it is an emergency. Avoid public transportation, ride-sharing, or taxis if possible. Get tested If you have symptoms of COVID-19, get tested. While waiting for test results, stay away from others, including staying apart from those living in your household. Get tested as soon as possible after your symptoms start. Treatments may be available for people with COVID-19 who are at risk for becoming very sick. Don't delay: Treatment must be started early to be effective--some treatments  must begin within 5 days of your first symptoms. Contact your healthcare provider right away if your test result is positive to determine if you are eligible. Self-tests are one of several options for testing for the virus that causes COVID-19 and may be more convenient than laboratory-based tests and point-of-care tests. Ask your healthcare provider or your local health department if you need help interpreting your test results. You can visit your state, tribal, local, and territorial health department's website to look for the latest local information on testing sites. Separate yourself from other people As much as possible, stay in a specific room and away from other people and pets in your home. If possible, you should use a separate bathroom. If you need to be around other people or animals in or outside of the home, wear a well-fitting mask. Tell your close contacts that they may have been exposed to COVID-19. An infected person can spread COVID-19 starting 48 hours (or 2 days) before the person has any symptoms or tests positive. By letting your close contacts know they may have been exposed to COVID-19, you are helping to protect everyone. See COVID-19 and Animals if you have questions about pets. If you are diagnosed with COVID-19, someone from the health department may call you. Answer the call to slow the spread. Monitor your symptoms Symptoms of COVID-19 include fever, cough, or other symptoms. Follow care instructions from your healthcare provider and local health department. Your local health authorities may give instructions on checking your symptoms and reporting information. When to seek emergency medical attention Look for emergency warning signs* for COVID-19. If someone is showing any of these signs, seek emergency medical care immediately: Trouble breathing Persistent pain or pressure in the chest New confusion Inability to wake or stay awake Pale, gray, or blue-colored skin,  lips, or nail beds, depending on skin tone *This list is not all possible symptoms. Please call your medical provider for any other symptoms that are severe or concerning to you. Call 911 or call ahead to your local emergency facility: Notify the operator that you are seeking care for someone who has or may have COVID-19. Call ahead before visiting your doctor Call ahead. Many medical visits for routine care are being postponed or done by phone or telemedicine.  If you have a medical appointment that cannot be postponed, call your doctor's office, and tell them you have or may have COVID-19. This will help the office protect themselves and other patients. If you are sick, wear a well-fitting mask You should wear a mask if you must be around other people or animals, including pets (even at home). Wear a mask with the best fit, protection, and comfort for you. You don't need to wear the mask if you are alone. If you can't put on a mask (because of trouble breathing, for example), cover your coughs and sneezes in some other way. Try to stay at least 6 feet away from other people. This will help protect the people around you. Masks should not be placed on young children under age 67 years, anyone who has trouble breathing, or anyone who is not able to remove the mask without help. Cover your coughs and sneezes Cover your mouth and nose with a tissue when you cough or sneeze. Throw away used tissues in a lined trash can. Immediately wash your hands with soap and water for at least 20 seconds. If soap and water are not available, clean your hands with an alcohol-based hand sanitizer that contains at least 60% alcohol. Clean your hands often Wash your hands often with soap and water for at least 20 seconds. This is especially important after blowing your nose, coughing, or sneezing; going to the bathroom; and before eating or preparing food. Use hand sanitizer if soap and water are not available. Use an  alcohol-based hand sanitizer with at least 60% alcohol, covering all surfaces of your hands and rubbing them together until they feel dry. Soap and water are the best option, especially if hands are visibly dirty. Avoid touching your eyes, nose, and mouth with unwashed hands. Handwashing Tips Avoid sharing personal household items Do not share dishes, drinking glasses, cups, eating utensils, towels, or bedding with other people in your home. Wash these items thoroughly after using them with soap and water or put in the dishwasher. Clean surfaces in your home regularly Clean and disinfect high-touch surfaces (for example, doorknobs, tables, handles, light switches, and countertops) in your "sick room" and bathroom. In shared spaces, you should clean and disinfect surfaces and items after each use by the person who is ill. If you are sick and cannot clean, a caregiver or other person should only clean and disinfect the area around you (such as your bedroom and bathroom) on an as needed basis. Your caregiver/other person should wait as long as possible (at least several hours) and wear a mask before entering, cleaning, and disinfecting shared spaces that you use. Clean and disinfect areas that may have blood, stool, or body fluids on them. Use household cleaners and disinfectants. Clean visible dirty surfaces with household cleaners containing soap or detergent. Then, use a household disinfectant. Use a product from [employer] List N: Disinfectants for Coronavirus (OIZTI-45). Be sure to follow the instructions on the label to ensure safe and effective use of the product. Many products recommend keeping the surface wet with a disinfectant for a certain period of time (look at "contact time" on the product label). You may also need to wear personal protective equipment, such as gloves, depending on the directions on the product label. Immediately after disinfecting, wash your hands with soap and water for 20  seconds. For completed guidance on cleaning and disinfecting your home, visit Complete Disinfection Guidance. Take steps to improve ventilation at home Improve ventilation (  air flow) at home to help prevent from spreading COVID-19 to other people in your household. Clear out COVID-19 virus particles in the air by opening windows, using air filters, and turning on fans in your home. Use this interactive tool to learn how to improve air flow in your home. When you can be around others after being sick with COVID-19 Deciding when you can be around others is different for different situations. Find out when you can safely end home isolation. For any additional questions about your care, contact your healthcare provider or state or local health department. 12/25/2020 Content source: [facility] for Immunization and Respiratory Diseases ([facility]), Division of Viral Diseases This information is not intended to replace advice given to you by your health care provider. Make sure you discuss any questions you have with your health care provider. Document Revised: 02/07/2021 Document Reviewed: 02/07/2021 Elsevier Patient Education  2022 [employer].       If you have been instructed to have an in-person evaluation today at a local Urgent Care facility, please use the link below. It will take you to a list of all of our available [facility], including address, phone number and hours of operation. Please do not delay care.  [facility]  If you or a family member do not have a primary care provider, use the link below to schedule a visit and establish care. When you choose a [facility] primary care physician or advanced practice provider, you gain a long-term partner in health. Find a Primary Care Provider  Learn more about [facility] in-office and virtual care options: [facility]

## 2022-11-10 ENCOUNTER — Telehealth: Payer: 59 | Admitting: Physician Assistant

## 2022-11-10 DIAGNOSIS — R6889 Other general symptoms and signs: Secondary | ICD-10-CM

## 2022-11-10 MED ORDER — ONDANSETRON HCL 4 MG PO TABS: 4.0000 mg | ORAL_TABLET | Freq: Three times a day (TID) | ORAL | 0 refills | Status: DC | PRN

## 2022-11-10 MED ORDER — XOFLUZA (40 MG DOSE) 1 X 40 MG PO TBPK: 40.0000 mg | ORAL_TABLET | Freq: Once | ORAL | 0 refills | Status: AC
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
## 2022-11-10 NOTE — Patient Instructions (Signed)
Caitlin Cain, thank you for joining [facility], [name] for today's virtual visit.  While this provider is not your primary care provider (PCP), if your PCP is located in our provider database this encounter information will be shared with them immediately following your visit.   [facility] account gives you access to today's visit and all your visits, tests, and labs performed at [facility] " click here if you don't have a [facility] account or go to [email or web address]  Consent: [name] provided verbal consent for this virtual visit at the beginning of the encounter.  Current Medications:  Current Outpatient Medications:    Baloxavir Marboxil,40 MG Dose, (XOFLUZA, 40 MG DOSE,) 1 x 40 MG TBPK, Take 40 mg by mouth once for 1 dose., Disp: 1 each, Rfl: 0   ondansetron (ZOFRAN) 4 MG tablet, Take 1 tablet (4 mg total) by mouth every 8 (eight) hours as needed for nausea or vomiting., Disp: 20 tablet, Rfl: 0   albuterol (VENTOLIN HFA) 108 (90 Base) MCG/ACT inhaler, Inhale 1-2 puffs into the lungs every 4 (four) hours as needed for wheezing or shortness of breath., Disp: 6.7 g, Rfl: 1   amoxicillin-clavulanate (AUGMENTIN) 875-125 MG tablet, Take 1 tablet by mouth 2 (two) times daily., Disp: 14 tablet, Rfl: 0   amphetamine-dextroamphetamine (ADDERALL) 20 MG tablet, Take 1 tablet by mouth twice a day as directed, Disp: 60 tablet, Rfl: 0   amphetamine-dextroamphetamine (ADDERALL) 20 MG tablet, Take 1 tablet by mouth twice a day as directed., Disp: 60 tablet, Rfl: 0   amphetamine-dextroamphetamine (ADDERALL) 20 MG tablet, Take 1 tablet by mouth twice a day as directed, Disp: 60 tablet, Rfl: 0   amphetamine-dextroamphetamine (ADDERALL) 20 MG tablet, Take 1 tablet by mouth twice a day as directed, Disp: 60 tablet, Rfl: 0   amphetamine-dextroamphetamine (ADDERALL) 20 MG tablet, Take 1 tablet by mouth twice a day as directed. May fill  11/22/2021, Disp: 60 tablet, Rfl: 0   amphetamine-dextroamphetamine (ADDERALL) 20 MG tablet, Take 1 tablet by mouth twice a day as directed., Disp: 60 tablet, Rfl: 0   amphetamine-dextroamphetamine (ADDERALL) 20 MG tablet, Take 1 tablet by mouth twice a day as directed, Disp: 60 tablet, Rfl: 0   amphetamine-dextroamphetamine (ADDERALL) 20 MG tablet, Take 1 tablet by mouth twice a day as directed, Disp: 60 tablet, Rfl: 0   brompheniramine-pseudoephedrine-DM 30-2-10 MG/5ML syrup, Take 5 mLs by mouth 4 (four) times daily as needed., Disp: 120 mL, Rfl: 0   budesonide (RHINOCORT AQUA) 32 MCG/ACT nasal spray, Place 2 sprays into both nostrils daily., Disp: 8.43 mL, Rfl: 6   celecoxib (CELEBREX) 200 MG capsule, Take 1 capsule by mouth daily if needed. Take with a meal., Disp: 30 capsule, Rfl: 1   fluconazole (DIFLUCAN) 150 MG tablet, Take 1 tablet by mouth at the first sign of symptoms of yeast. If no resolution, repeat dose in 72 hours., Disp: 2 tablet, Rfl: 2   hydrOXYzine (ATARAX) 25 MG tablet, Take 1/2 to 2 tablet by mouth twice a day as needed, Disp: 90 tablet, Rfl: 1   hydrOXYzine (ATARAX) 25 MG tablet, Take 1/2 to 2 tablet by mouth twice a day as needed, Disp: 90 tablet, Rfl: 2   hydrOXYzine (ATARAX) 25 MG tablet, Take 1/2 to 2 tablet by mouth twice a day as needed, Disp: 90 tablet, Rfl: 0   hydrOXYzine (ATARAX/VISTARIL) 25 MG tablet, Take 1/2 to 2 tablets by mouth twice a day as needed, Disp: 90  tablet, Rfl: 1   hydrOXYzine (ATARAX/VISTARIL) 25 MG tablet, Take 1/2 to 2 tablets by mouth twice a day as needed, Disp: 90 tablet, Rfl: 1   hydrOXYzine (ATARAX/VISTARIL) 25 MG tablet, Take 1/2 to 2 tablets by mouth twice a day as needed, Disp: 90 tablet, Rfl: 1   levocetirizine (XYZAL) 5 MG tablet, Take 1 tablet (5 mg total) by mouth every evening., Disp: 30 tablet, Rfl: 11   meloxicam (MOBIC) 7.5 MG tablet, Take 1 tablet by mouth daily with a meal as needed, Disp: 30 tablet, Rfl: 1   molnupiravir EUA  (LAGEVRIO) 200 mg CAPS capsule, Take 4 capsules (800 mg total) by mouth 2 (two) times daily for 5 days., Disp: 40 capsule, Rfl: 0   OVER THE COUNTER MEDICATION, Multiple Vitamin-Gummie-Take 1 by mouth daily., Disp: , Rfl:    voriconazole (VFEND) 200 MG tablet, Take 1 tablet (200 mg total) by mouth 2 (two) times daily., Disp: 14 tablet, Rfl: 1   Medications ordered in this encounter:  Meds ordered this encounter  Medications   Baloxavir Marboxil,40 MG Dose, (XOFLUZA, 40 MG DOSE,) 1 x 40 MG TBPK    Sig: Take 40 mg by mouth once for 1 dose.    Dispense:  1 each    Refill:  0    Order Specific Question:   Supervising Provider    Answer:   Chase Picket [[id number]]   ondansetron (ZOFRAN) 4 MG tablet    Sig: Take 1 tablet (4 mg total) by mouth every 8 (eight) hours as needed for nausea or vomiting.    Dispense:  20 tablet    Refill:  0    Order Specific Question:   Supervising Provider    Answer:   Chase Picket [id number]     *If you need refills on other medications prior to your next appointment, please contact your pharmacy*  Follow-Up: Call back or seek an in-person evaluation if the symptoms worsen or if the condition fails to improve as anticipated.  Caitlin Cain [phone]  Other Instructions Rest, drink plenty of fluids.  Can take Zofran for nausea.  Can take ibuprofen or tylenol as needed for fever.  Follow up with Urgent Care or with your PCP for an in person visit if symptoms become worse.    If you have been instructed to have an in-person evaluation today at a local Urgent Care facility, please use the link below. It will take you to a list of all of our available [facility], including address, phone number and hours of operation. Please do not delay care.  [facility]  If you or a family member do not have a primary care provider, use the link below to schedule a visit and establish care. When you choose a [facility] primary  care physician or advanced practice provider, you gain a long-term partner in health. Find a Primary Care Provider  Learn more about [facility] in-office and virtual care options: [facility]

## 2022-12-01 ENCOUNTER — Ambulatory Visit: Payer: Self-pay | Admitting: Nurse Practitioner

## 2022-12-15 ENCOUNTER — Other Ambulatory Visit (HOSPITAL_COMMUNITY): Payer: Self-pay

## 2022-12-15 MED ORDER — CELECOXIB 200 MG PO CAPS
200.0000 mg | ORAL_CAPSULE | Freq: Every day | ORAL | 1 refills | Status: DC | PRN
  Filled 2022-12-16: qty 30, 30d supply, fill #0

## 2022-12-16 ENCOUNTER — Other Ambulatory Visit (HOSPITAL_COMMUNITY): Payer: Self-pay

## 2022-12-16 ENCOUNTER — Other Ambulatory Visit: Payer: Self-pay

## 2022-12-24 ENCOUNTER — Other Ambulatory Visit (HOSPITAL_COMMUNITY): Payer: Self-pay

## 2023-01-14 ENCOUNTER — Ambulatory Visit (INDEPENDENT_AMBULATORY_CARE_PROVIDER_SITE_OTHER): Payer: 59 | Admitting: Nurse Practitioner

## 2023-01-14 ENCOUNTER — Encounter: Payer: Self-pay | Admitting: Nurse Practitioner

## 2023-01-14 VITALS — BP 122/82 | HR 68 | Ht 69.0 in | Wt 147.0 lb

## 2023-01-14 DIAGNOSIS — Z0000 Encounter for general adult medical examination without abnormal findings: Secondary | ICD-10-CM | POA: Diagnosis not present

## 2023-01-14 DIAGNOSIS — F419 Anxiety disorder, unspecified: Secondary | ICD-10-CM | POA: Diagnosis not present

## 2023-01-14 DIAGNOSIS — F4310 Post-traumatic stress disorder, unspecified: Secondary | ICD-10-CM | POA: Diagnosis not present

## 2023-01-14 DIAGNOSIS — T7431XD Adult psychological abuse, confirmed, subsequent encounter: Secondary | ICD-10-CM

## 2023-01-14 DIAGNOSIS — F900 Attention-deficit hyperactivity disorder, predominantly inattentive type: Secondary | ICD-10-CM

## 2023-01-14 NOTE — Patient Instructions (Addendum)
I wish you the best on your search for a new job! I know you will find a perfect home that you can use all of your skills.   It is time to get your first colon cancer screening. Once things calm down a bit and you are settled in a new job we can do that.   For all adult patients, I recommend A well balanced diet low in saturated fats, cholesterol, and moderation in carbohydrates.   This can be as simple as monitoring portion sizes and cutting back on sugary beverages such as soda and juice to start with.    Daily water consumption of at least 64 ounces.  Physical activity at least 180 minutes per week, if just starting out.   This can be as simple as taking the stairs instead of the elevator and walking 2-3 laps around the office  purposefully every day.   STD protection, partner selection, and regular testing if high risk.  Limited consumption of alcoholic beverages if alcohol is consumed.  For women, I recommend no more than 7 alcoholic beverages per week, spread out throughout the week.  Avoid "binge" drinking or consuming large quantities of alcohol in one setting.   Please let me know if you feel you may need help with reduction or quitting alcohol consumption.   Avoidance of nicotine, if used.  Please let me know if you feel you may need help with reduction or quitting nicotine use.   Daily mental health attention.  This can be in the form of 5 minute daily meditation, prayer, journaling, yoga, reflection, etc.   Purposeful attention to your emotions and mental state can significantly improve your overall wellbeing  and  Health.  Please know that I am here to help you with all of your health care goals and am happy to work with you to find a solution that works best for you.  The greatest advice I have received with any changes in life are to take it one step at a time, that even means if all you can focus on is the next 60 seconds, then do that and celebrate your victories.  With  any changes in life, you will have set backs, and that is OK. The important thing to remember is, if you have a set back, it is not a failure, it is an opportunity to try again!  Health Maintenance Recommendations Screening Testing Mammogram Every 1 -2 years based on history and risk factors Starting at age 9 Pap Smear Ages 21-39 every 3 years Ages 3-65 every 5 years with HPV testing More frequent testing may be required based on results and history Colon Cancer Screening Every 1-10 years based on test performed, risk factors, and history Starting at age 76 Bone Density Screening Every 2-10 years based on history Starting at age 75 for women Recommendations for men differ based on medication usage, history, and risk factors AAA Screening One time ultrasound Men 4-31 years old who have every smoked Lung Cancer Screening Low Dose Lung CT every 12 months Age 33-80 years with a 30 pack-year smoking history who still smoke or who have quit within the last 15 years  Screening Labs Routine  Labs: Complete Blood Count (CBC), Complete Metabolic Panel (CMP), Cholesterol (Lipid Panel) Every 6-12 months based on history and medications May be recommended more frequently based on current conditions or previous results Hemoglobin A1c Lab Every 3-12 months based on history and previous results Starting at age 83 or earlier  with diagnosis of diabetes, high cholesterol, BMI >26, and/or risk factors Frequent monitoring for patients with diabetes to ensure blood sugar control Thyroid Panel (TSH w/ T3 & T4) Every 6 months based on history, symptoms, and risk factors May be repeated more often if on medication HIV One time testing for all patients 48 and older May be repeated more frequently for patients with increased risk factors or exposure Hepatitis C One time testing for all patients 16 and older May be repeated more frequently for patients with increased risk factors or  exposure Gonorrhea, Chlamydia Every 12 months for all sexually active persons 13-24 years Additional monitoring may be recommended for those who are considered high risk or who have symptoms PSA Men 45-10 years old with risk factors Additional screening may be recommended from age 59-69 based on risk factors, symptoms, and history  Vaccine Recommendations Tetanus Booster All adults every 10 years Flu Vaccine All patients 6 months and older every year COVID Vaccine All patients 12 years and older Initial dosing with booster May recommend additional booster based on age and health history HPV Vaccine 2 doses all patients age 79-26 Dosing may be considered for patients over 26 Shingles Vaccine (Shingrix) 2 doses all adults 55 years and older Pneumonia (Pneumovax 23) All adults 65 years and older May recommend earlier dosing based on health history Pneumonia (Prevnar 4) All adults 65 years and older Dosed 1 year after Pneumovax 23  Additional Screening, Testing, and Vaccinations may be recommended on an individualized basis based on family history, health history, risk factors, and/or exposure.
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
## 2023-01-15 LAB — CBC WITH DIFFERENTIAL/PLATELET
Basophils Absolute: 0.1 x10E3/uL (ref 0.0–0.2)
Basos: 1 %
EOS (ABSOLUTE): 0.4 x10E3/uL (ref 0.0–0.4)
Eos: 5 %
Hematocrit: 43.9 % (ref 34.0–46.6)
Hemoglobin: 15 g/dL (ref 11.1–15.9)
Immature Grans (Abs): 0 x10E3/uL (ref 0.0–0.1)
Immature Granulocytes: 0 %
Lymphocytes Absolute: 2.2 x10E3/uL (ref 0.7–3.1)
Lymphs: 30 %
MCH: 30.3 pg (ref 26.6–33.0)
MCHC: 34.2 g/dL (ref 31.5–35.7)
MCV: 89 fL (ref 79–97)
Monocytes Absolute: 0.5 x10E3/uL (ref 0.1–0.9)
Monocytes: 7 %
Neutrophils Absolute: 4.3 x10E3/uL (ref 1.4–7.0)
Neutrophils: 57 %
Platelets: 285 x10E3/uL (ref 150–450)
RBC: 4.95 x10E6/uL (ref 3.77–5.28)
RDW: 11.7 % (ref 11.7–15.4)
WBC: 7.5 x10E3/uL (ref 3.4–10.8)

## 2023-01-15 LAB — COMPREHENSIVE METABOLIC PANEL WITH GFR
ALT: 16 IU/L (ref 0–32)
AST: 20 IU/L (ref 0–40)
Albumin/Globulin Ratio: 1.9 (ref 1.2–2.2)
Albumin: 4.7 g/dL (ref 3.9–4.9)
Alkaline Phosphatase: 55 IU/L (ref 44–121)
BUN/Creatinine Ratio: 7 — ABNORMAL LOW (ref 9–23)
BUN: 6 mg/dL (ref 6–24)
Bilirubin Total: 0.7 mg/dL (ref 0.0–1.2)
CO2: 22 mmol/L (ref 20–29)
Calcium: 9.9 mg/dL (ref 8.7–10.2)
Chloride: 103 mmol/L (ref 96–106)
Creatinine, Ser: 0.83 mg/dL (ref 0.57–1.00)
Globulin, Total: 2.5 g/dL (ref 1.5–4.5)
Glucose: 92 mg/dL (ref 70–99)
Potassium: 4.5 mmol/L (ref 3.5–5.2)
Sodium: 142 mmol/L (ref 134–144)
Total Protein: 7.2 g/dL (ref 6.0–8.5)
eGFR: 88 mL/min/1.73

## 2023-01-15 LAB — LIPID PANEL
Chol/HDL Ratio: 2.8 ratio (ref 0.0–4.4)
Cholesterol, Total: 194 mg/dL (ref 100–199)
HDL: 70 mg/dL (ref >39)
LDL Chol Calc (NIH): 110 mg/dL — ABNORMAL HIGH (ref 0–99)
Triglycerides: 75 mg/dL (ref 0–149)
VLDL Cholesterol Cal: 14 mg/dL (ref 5–40)

## 2023-01-19 NOTE — Assessment & Plan Note (Signed)
CPE today. Labs pending. Will make changes as necessary based on results.  Review of HM activities and recommendations discussed and provided on AVS Anticipatory guidance, diet, and exercise recommendations provided.  Medications, allergies, and hx reviewed and updated as necessary.  Plan to f/u with CPE in 1 year or sooner for acute/chronic health needs as directed.   

## 2023-01-19 NOTE — Assessment & Plan Note (Signed)
Currently managed with adderall daily and tolerating well. Refills provided today.  PDMP reviewed.

## 2023-01-19 NOTE — Assessment & Plan Note (Signed)
The patient reports ongoing stress, anxiety, and post-traumatic symptoms related to personal and professional challenges. She has a history of counseling but has not been attending sessions due to insurance issues. She is currently taking Adderall for focus. Her mood appears stable today with no alarm symptoms present. Plan: - Encourage the patient to seek counseling once insurance issues are resolved. - Continue Adderall as prescribed. - Monitor mental health status during future visits.

## 2023-01-19 NOTE — Assessment & Plan Note (Signed)
The patient reports ongoing stress, anxiety, and post-traumatic symptoms related to personal and professional challenges. She has a history of counseling but has not been attending sessions due to insurance issues.  Her mood appears stable today with no alarm symptoms present. Plan: - Encourage the patient to seek counseling once insurance issues are resolved. - Monitor mental health status during future visits. - Consider starting low dose daily medication as needed to help with symptoms.

## 2023-04-04 DIAGNOSIS — W57XXXA Bitten or stung by nonvenomous insect and other nonvenomous arthropods, initial encounter: Secondary | ICD-10-CM | POA: Diagnosis not present

## 2023-04-04 DIAGNOSIS — S30861A Insect bite (nonvenomous) of abdominal wall, initial encounter: Secondary | ICD-10-CM | POA: Diagnosis not present

## 2023-07-28 ENCOUNTER — Telehealth: Payer: Medicaid Other | Admitting: Physician Assistant

## 2023-07-28 ENCOUNTER — Other Ambulatory Visit (HOSPITAL_COMMUNITY): Payer: Self-pay

## 2023-07-28 DIAGNOSIS — B9689 Other specified bacterial agents as the cause of diseases classified elsewhere: Secondary | ICD-10-CM

## 2023-07-28 DIAGNOSIS — J019 Acute sinusitis, unspecified: Secondary | ICD-10-CM

## 2023-07-28 MED ORDER — AMOXICILLIN-POT CLAVULANATE 875-125 MG PO TABS
1.0000 | ORAL_TABLET | Freq: Two times a day (BID) | ORAL | 0 refills | Status: DC
  Filled 2023-07-28: qty 14, 7d supply, fill #0

## 2023-07-28 NOTE — Patient Instructions (Signed)
Caitlin Cain, thank you for joining Piedad Climes, [name] for today's virtual visit.  While this provider is not your primary care provider (PCP), if your PCP is located in our provider database this encounter information will be shared with them immediately following your visit.   A [place] MyChart account gives you access to today's visit and all your visits, tests, and labs performed at [facility] " click here if you don't have a [place] MyChart account or go to [email or web address]  Consent: [name] provided verbal consent for this virtual visit at the beginning of the encounter.  Current Medications:  Current Outpatient Medications:    albuterol (VENTOLIN HFA) 108 (90 Base) MCG/ACT inhaler, Inhale 1-2 puffs into the lungs every 4 (four) hours as needed for wheezing or shortness of breath., Disp: 6.7 g, Rfl: 1   amphetamine-dextroamphetamine (ADDERALL) 20 MG tablet, Take 1 tablet by mouth twice a day as directed. May fill 11/22/2021, Disp: 60 tablet, Rfl: 0   amphetamine-dextroamphetamine (ADDERALL) 20 MG tablet, Take 1 tablet by mouth twice a day as directed., Disp: 60 tablet, Rfl: 0   amphetamine-dextroamphetamine (ADDERALL) 20 MG tablet, Take 1 tablet by mouth twice a day as directed, Disp: 60 tablet, Rfl: 0   budesonide (RHINOCORT AQUA) 32 MCG/ACT nasal spray, Place 2 sprays into both nostrils daily., Disp: 8.43 mL, Rfl: 6   hydrOXYzine (ATARAX) 25 MG tablet, Take 1/2 to 2 tablet by mouth twice a day as needed, Disp: 90 tablet, Rfl: 0   levocetirizine (XYZAL) 5 MG tablet, Take 1 tablet (5 mg total) by mouth every evening., Disp: 30 tablet, Rfl: 11   OVER THE COUNTER MEDICATION, Multiple Vitamin-Gummie-Take 1 by mouth daily., Disp: , Rfl:    Medications ordered in this encounter:  No orders of the defined types were placed in this encounter.    *If you need refills on other medications prior to your next appointment, please  contact your pharmacy*  Follow-Up: Call back or seek an in-person evaluation if the symptoms worsen or if the condition fails to improve as anticipated.  [facility] [phone]  Other Instructions Please take antibiotic as directed.  Increase fluid intake.  Use Saline nasal spray.  Take a daily multivitamin. Ok to continue your Flonase and Claritin. Use Mucinex-DM OTC as well.  Place a humidifier in the bedroom.  Please call or return clinic if symptoms are not improving.  Sinusitis Sinusitis is redness, soreness, and swelling (inflammation) of the paranasal sinuses. Paranasal sinuses are air pockets within the bones of your face (beneath the eyes, the middle of the forehead, or above the eyes). In healthy paranasal sinuses, mucus is able to drain out, and air is able to circulate through them by way of your nose. However, when your paranasal sinuses are inflamed, mucus and air can become trapped. This can allow bacteria and other germs to grow and cause infection. Sinusitis can develop quickly and last only a short time (acute) or continue over a long period (chronic). Sinusitis that lasts for more than 12 weeks is considered chronic.  CAUSES  Causes of sinusitis include: Allergies. Structural abnormalities, such as displacement of the cartilage that separates your nostrils (deviated septum), which can decrease the air flow through your nose and sinuses and affect sinus drainage. Functional abnormalities, such as when the small hairs (cilia) that line your sinuses and help remove mucus do not work properly or are not present. SYMPTOMS  Symptoms of acute and  chronic sinusitis are the same. The primary symptoms are pain and pressure around the affected sinuses. Other symptoms include: Upper toothache. Earache. Headache. Bad breath. Decreased sense of smell and taste. A cough, which worsens when you are lying flat. Fatigue. Fever. Thick drainage from your nose, which often  is green and may contain pus (purulent). Swelling and warmth over the affected sinuses. DIAGNOSIS  Your caregiver will perform a physical exam. During the exam, your caregiver may: Look in your nose for signs of abnormal growths in your nostrils (nasal polyps). Tap over the affected sinus to check for signs of infection. View the inside of your sinuses (endoscopy) with a special imaging device with a light attached (endoscope), which is inserted into your sinuses. If your caregiver suspects that you have chronic sinusitis, one or more of the following tests may be recommended: Allergy tests. Nasal culture A sample of mucus is taken from your nose and sent to a lab and screened for bacteria. Nasal cytology A sample of mucus is taken from your nose and examined by your caregiver to determine if your sinusitis is related to an allergy. TREATMENT  Most cases of acute sinusitis are related to a viral infection and will resolve on their own within 10 days. Sometimes medicines are prescribed to help relieve symptoms (pain medicine, decongestants, nasal steroid sprays, or saline sprays).  However, for sinusitis related to a bacterial infection, your caregiver will prescribe antibiotic medicines. These are medicines that will help kill the bacteria causing the infection.  Rarely, sinusitis is caused by a fungal infection. In theses cases, your caregiver will prescribe antifungal medicine. For some cases of chronic sinusitis, surgery is needed. Generally, these are cases in which sinusitis recurs more than 3 times per year, despite other treatments. HOME CARE INSTRUCTIONS  Drink plenty of water. Water helps thin the mucus so your sinuses can drain more easily. Use a humidifier. Inhale steam 3 to 4 times a day (for example, sit in the bathroom with the shower running). Apply a warm, moist washcloth to your face 3 to 4 times a day, or as directed by your caregiver. Use saline nasal sprays to help moisten  and clean your sinuses. Take over-the-counter or prescription medicines for pain, discomfort, or fever only as directed by your caregiver. SEEK IMMEDIATE MEDICAL CARE IF: You have increasing pain or severe headaches. You have nausea, vomiting, or drowsiness. You have swelling around your face. You have vision problems. You have a stiff neck. You have difficulty breathing. MAKE SURE YOU:  Understand these instructions. Will watch your condition. Will get help right away if you are not doing well or get worse. Document Released: 09/22/2005 Document Revised: 12/15/2011 Document Reviewed: 10/07/2011 [facility] Patient Information 2014 [place], [place].    If you have been instructed to have an in-person evaluation today at a local Urgent Care facility, please use the link below. It will take you to a list of all of our available [place] [facility], including address, phone number and hours of operation. Please do not delay care.  [facility]  If you or a family member do not have a primary care provider, use the link below to schedule a visit and establish care. When you choose a [facility] primary care physician or advanced practice provider, you gain a long-term partner in health. Find a Primary Care Provider  Learn more about [place]'s in-office and virtual care options: [place] - Get Care Now
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
## 2023-09-18 ENCOUNTER — Other Ambulatory Visit (HOSPITAL_COMMUNITY): Payer: Self-pay

## 2023-09-18 ENCOUNTER — Telehealth: Payer: Medicaid Other | Admitting: Family Medicine

## 2023-09-18 DIAGNOSIS — Z20818 Contact with and (suspected) exposure to other bacterial communicable diseases: Secondary | ICD-10-CM

## 2023-09-18 DIAGNOSIS — J029 Acute pharyngitis, unspecified: Secondary | ICD-10-CM | POA: Diagnosis not present

## 2023-09-18 MED ORDER — AMOXICILLIN 500 MG PO CAPS
500.0000 mg | ORAL_CAPSULE | Freq: Two times a day (BID) | ORAL | 0 refills | Status: AC
  Filled 2023-09-18: qty 20, 10d supply, fill #0

## 2023-09-18 MED ORDER — LIDOCAINE VISCOUS HCL 2 % MT SOLN
15.0000 mL | OROMUCOSAL | 0 refills | Status: AC | PRN
  Filled 2023-09-18: qty 100, 3d supply, fill #0

## 2023-09-18 NOTE — Patient Instructions (Addendum)
Roanna Raider, thank you for joining Freddy Finner, NP for today's virtual visit.  While this provider is not your primary care provider (PCP), if your PCP is located in our provider database this encounter information will be shared with them immediately following your visit.   A [place] MyChart account gives you access to today's visit and all your visits, tests, and labs performed at [facility] " click here if you don't have a [place] MyChart account or go to [email or web address]  Consent: [name] provided verbal consent for this virtual visit at the beginning of the encounter.  Current Medications:  Current Outpatient Medications:    albuterol (VENTOLIN HFA) 108 (90 Base) MCG/ACT inhaler, Inhale 1-2 puffs into the lungs every 4 (four) hours as needed for wheezing or shortness of breath., Disp: 6.7 g, Rfl: 1   amoxicillin (AMOXIL) 500 MG tablet, Take 1 tablet (500 mg total) by mouth 2 (two) times daily for 10 days., Disp: 20 tablet, Rfl: 0   amphetamine-dextroamphetamine (ADDERALL) 20 MG tablet, Take 1 tablet by mouth twice a day as directed, Disp: 60 tablet, Rfl: 0   budesonide (RHINOCORT AQUA) 32 MCG/ACT nasal spray, Place 2 sprays into both nostrils daily., Disp: 8.43 mL, Rfl: 6   hydrOXYzine (ATARAX) 25 MG tablet, Take 1/2 to 2 tablet by mouth twice a day as needed, Disp: 90 tablet, Rfl: 0   levocetirizine (XYZAL) 5 MG tablet, Take 1 tablet (5 mg total) by mouth every evening., Disp: 30 tablet, Rfl: 11   lidocaine (XYLOCAINE) 2 % solution, Use as directed 15 mLs in the mouth or throat as needed for mouth pain., Disp: 100 mL, Rfl: 0   OVER THE COUNTER MEDICATION, Multiple Vitamin-Gummie-Take 1 by mouth daily., Disp: , Rfl:    Medications ordered in this encounter:  Meds ordered this encounter  Medications   amoxicillin (AMOXIL) 500 MG tablet    Sig: Take 1 tablet (500 mg total) by mouth 2 (two) times daily for 10 days.     Dispense:  20 tablet    Refill:  0    Supervising Provider:   Merrilee Jansky [[id number]]   lidocaine (XYLOCAINE) 2 % solution    Sig: Use as directed 15 mLs in the mouth or throat as needed for mouth pain.    Dispense:  100 mL    Refill:  0    Supervising Provider:   Merrilee Jansky [[id number]]     *If you need refills on other medications prior to your next appointment, please contact your pharmacy*  Follow-Up: Call back or seek an in-person evaluation if the symptoms worsen or if the condition fails to improve as anticipated.  [facility] [phone]  Other Instructions    Strep Throat, Adult Strep throat is an infection of the throat. It is caused by germs (bacteria). Strep throat is common during the cold months of the year. It mostly affects children who are 16-36 years old. However, people of all ages can get it at any time of the year. This infection spreads from person to person through coughing, sneezing, or having close contact. What are the causes? This condition is caused by the Streptococcus pyogenes germ. What increases the risk? You care for young children. Children are more likely to get strep throat and may spread it to others. You go to crowded places. Germs can spread easily in such places. You kiss or touch someone who has strep throat. What are  the signs or symptoms? Fever or chills. Redness, swelling, or pain in the tonsils or throat. Pain or trouble when swallowing. White or yellow spots on the tonsils or throat. Tender glands in the neck and under the jaw. Bad breath. Red rash all over the body. This is rare. How is this treated? Medicines that kill germs (antibiotics). Medicines that treat pain or fever. These include: Ibuprofen or acetaminophen. Aspirin, only for people who are over the age of 80. Cough drops. Throat sprays. Follow these instructions at home: Medicines  Take over-the-counter and prescription medicines only as  told by your doctor. Take your antibiotic medicine as told by your doctor. Do not stop taking the antibiotic even if you start to feel better. Eating and drinking  If you have trouble swallowing, eat soft foods until your throat feels better. Drink enough fluid to keep your pee (urine) pale yellow. To help with pain, you may have: Warm fluids, such as soup and tea. Cold fluids, such as frozen desserts or popsicles. General instructions Rinse your mouth (gargle) with a salt-water mixture 3-4 times a day or as needed. To make a salt-water mixture, dissolve -1 tsp (3-6 g) of salt in 1 cup (237 mL) of warm water. Rest as much as you can. Stay home from work or school until you have been taking antibiotics for 24 hours. Do not smoke or use any products that contain nicotine or tobacco. If you need help quitting, ask your doctor. Keep all follow-up visits. How is this prevented?  Do not share food, drinking cups, or personal items. They can cause the germs to spread. Wash your hands well with soap and water. Make sure that all people in your house wash their hands well. Have family members tested if they have a fever or a sore throat. They may need an antibiotic if they have strep throat. Contact a doctor if: You have swelling in your neck that keeps getting bigger. You get a rash, cough, or earache. You cough up a thick fluid that is green, yellow-brown, or bloody. You have pain that does not get better with medicine. Your symptoms get worse instead of getting better. You have a fever. Get help right away if: You vomit. You have a very bad headache. Your neck hurts or feels stiff. You have chest pain or are short of breath. You have drooling, very bad throat pain, or changes in your voice. Your neck is swollen, or the skin gets red and tender. Your mouth is dry, or you are peeing less than normal. You keep feeling more tired or have trouble waking up. Your joints are red or  painful. These symptoms may be an emergency. Do not wait to see if the symptoms will go away. Get help right away. Call your local emergency services (911 in the [place] Summary Strep throat is an infection of the throat. It is caused by germs (bacteria). This infection can spread from person to person through coughing, sneezing, or having close contact. Take your medicines, including antibiotics, as told by your doctor. Do not stop taking the antibiotic even if you start to feel better. To prevent the spread of germs, wash your hands well with soap and water. Have others do the same. Do not share food, drinking cups, or personal items. Get help right away if you have a bad headache, chest pain, shortness of breath, a stiff or painful neck, or you vomit. This information is not intended to replace advice given to you  by your health care provider. Make sure you discuss any questions you have with your health care provider. Document Revised: 01/15/2021 Document Reviewed: 01/15/2021 Elsevier Patient Education  2024 [employer]    If you have been instructed to have an in-person evaluation today at a local Urgent Care facility, please use the link below. It will take you to a list of all of our available [place] [facility], including address, phone number and hours of operation. Please do not delay care.  [facility]  If you or a family member do not have a primary care provider, use the link below to schedule a visit and establish care. When you choose a [facility] primary care physician or advanced practice provider, you gain a long-term partner in health. Find a Primary Care Provider  Learn more about [facility]'s in-office and virtual care options: [place] - Get Care Now
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
## 2023-11-27 DIAGNOSIS — M545 Low back pain, unspecified: Secondary | ICD-10-CM | POA: Diagnosis not present

## 2023-12-15 DIAGNOSIS — M47816 Spondylosis without myelopathy or radiculopathy, lumbar region: Secondary | ICD-10-CM | POA: Diagnosis not present

## 2024-01-05 DIAGNOSIS — M47816 Spondylosis without myelopathy or radiculopathy, lumbar region: Secondary | ICD-10-CM | POA: Diagnosis not present

## 2024-01-28 DIAGNOSIS — M47816 Spondylosis without myelopathy or radiculopathy, lumbar region: Secondary | ICD-10-CM | POA: Diagnosis not present

## 2024-03-28 ENCOUNTER — Other Ambulatory Visit (HOSPITAL_COMMUNITY): Payer: Self-pay

## 2024-03-28 DIAGNOSIS — M25561 Pain in right knee: Secondary | ICD-10-CM | POA: Diagnosis not present

## 2024-03-28 MED ORDER — CELECOXIB 200 MG PO CAPS
200.0000 mg | ORAL_CAPSULE | Freq: Every day | ORAL | 1 refills | Status: AC | PRN
  Filled 2024-03-28: qty 30, 30d supply, fill #0

## 2024-04-11 ENCOUNTER — Other Ambulatory Visit (HOSPITAL_COMMUNITY): Payer: Self-pay

## 2024-07-25 ENCOUNTER — Other Ambulatory Visit (HOSPITAL_COMMUNITY): Payer: Self-pay

## 2024-07-25 ENCOUNTER — Telehealth: Admitting: Physician Assistant

## 2024-07-25 DIAGNOSIS — J019 Acute sinusitis, unspecified: Secondary | ICD-10-CM | POA: Diagnosis not present

## 2024-07-25 DIAGNOSIS — B9689 Other specified bacterial agents as the cause of diseases classified elsewhere: Secondary | ICD-10-CM | POA: Diagnosis not present

## 2024-07-25 MED ORDER — AMOXICILLIN-POT CLAVULANATE 875-125 MG PO TABS
1.0000 | ORAL_TABLET | Freq: Two times a day (BID) | ORAL | 0 refills | Status: AC
  Filled 2024-07-25: qty 14, 7d supply, fill #0

## 2024-07-25 NOTE — Patient Instructions (Signed)
 Caitlin Cain, thank you for joining Delon CHRISTELLA Dickinson, [name] for today's virtual visit.  While this provider is not your primary care provider (PCP), if your PCP is located in our provider database this encounter information will be shared with them immediately following your visit.   A [facility] MyChart account gives you access to today's visit and all your visits, tests, and labs performed at [facility]  click here if you don't have a [facility] MyChart account or go to [email or web address]  Consent: [name] provided verbal consent for this virtual visit at the beginning of the encounter.  Current Medications:  Current Outpatient Medications:    amoxicillin -clavulanate (AUGMENTIN ) 875-125 MG tablet, Take 1 tablet by mouth 2 (two) times daily., Disp: 14 tablet, Rfl: 0   albuterol  (VENTOLIN  HFA) 108 (90 Base) MCG/ACT inhaler, Inhale 1-2 puffs into the lungs every 4 (four) hours as needed for wheezing or shortness of breath., Disp: 6.7 g, Rfl: 1   amphetamine -dextroamphetamine  (ADDERALL) 20 MG tablet, Take 1 tablet by mouth twice a day as directed, Disp: 60 tablet, Rfl: 0   budesonide  (RHINOCORT  AQUA) 32 MCG/ACT nasal spray, Place 2 sprays into both nostrils daily., Disp: 8.43 mL, Rfl: 6   celecoxib  (CELEBREX ) 200 MG capsule, Take 1 capsule every day by oral route as needed for 30 days., Disp: 30 capsule, Rfl: 1   hydrOXYzine  (ATARAX ) 25 MG tablet, Take 1/2 to 2 tablet by mouth twice a day as needed, Disp: 90 tablet, Rfl: 0   levocetirizine (XYZAL ) 5 MG tablet, Take 1 tablet (5 mg total) by mouth every evening., Disp: 30 tablet, Rfl: 11   lidocaine  (XYLOCAINE ) 2 % solution, Use as directed 15 mLs in the mouth or throat as needed for mouth pain., Disp: 100 mL, Rfl: 0   OVER THE COUNTER MEDICATION, Multiple Vitamin-Gummie-Take 1 by mouth daily., Disp: , Rfl:    Medications ordered in this encounter:  Meds ordered this encounter  Medications    amoxicillin -clavulanate (AUGMENTIN ) 875-125 MG tablet    Sig: Take 1 tablet by mouth 2 (two) times daily.    Dispense:  14 tablet    Refill:  0    Supervising Provider:   BLAISE ALEENE KIDD [[id number]]     *If you need refills on other medications prior to your next appointment, please contact your pharmacy*  Follow-Up: Call back or seek an in-person evaluation if the symptoms worsen or if the condition fails to improve as anticipated.  [facility] [phone]  Other Instructions Sinus Infection, Adult A sinus infection, also called sinusitis, is inflammation of your sinuses. Sinuses are hollow spaces in the bones around your face. Your sinuses are located: Around your eyes. In the middle of your forehead. Behind your nose. In your cheekbones. Mucus normally drains out of your sinuses. When your nasal tissues become inflamed or swollen, mucus can become trapped or blocked. This allows bacteria, viruses, and fungi to grow, which leads to infection. Most infections of the sinuses are caused by a virus. A sinus infection can develop quickly. It can last for up to 4 weeks (acute) or for more than 12 weeks (chronic). A sinus infection often develops after a cold. What are the causes? This condition is caused by anything that creates swelling in the sinuses or stops mucus from draining. This includes: Allergies. Asthma. Infection from bacteria or viruses. Deformities or blockages in your nose or sinuses. Abnormal growths in the nose (nasal polyps). Pollutants, such as  chemicals or irritants in the air. Infection from fungi. This is rare. What increases the risk? You are more likely to develop this condition if you: Have a weak body defense system (immune system). Do a lot of swimming or diving. Overuse nasal sprays. Smoke. What are the signs or symptoms? The main symptoms of this condition are pain and a feeling of pressure around the affected sinuses. Other symptoms  include: Stuffy nose or congestion that makes it difficult to breathe through your nose. Thick yellow or greenish drainage from your nose. Tenderness, swelling, and warmth over the affected sinuses. A cough that may get worse at night. Decreased sense of smell and taste. Extra mucus that collects in the throat or the back of the nose (postnasal drip) causing a sore throat or bad breath. Tiredness (fatigue). Fever. How is this diagnosed? This condition is diagnosed based on: Your symptoms. Your medical history. A physical exam. Tests to find out if your condition is acute or chronic. This may include: Checking your nose for nasal polyps. Viewing your sinuses using a device that has a light (endoscope). Testing for allergies or bacteria. Imaging tests, such as an MRI or CT scan. In rare cases, a bone biopsy may be done to rule out more serious types of fungal sinus disease. How is this treated? Treatment for a sinus infection depends on the cause and whether your condition is chronic or acute. If caused by a virus, your symptoms should go away on their own within 10 days. You may be given medicines to relieve symptoms. They include: Medicines that shrink swollen nasal passages (decongestants). A spray that eases inflammation of the nostrils (topical intranasal corticosteroids). Rinses that help get rid of thick mucus in your nose (nasal saline washes). Medicines that treat allergies (antihistamines). Over-the-counter pain relievers. If caused by bacteria, your health care provider may recommend waiting to see if your symptoms improve. Most bacterial infections will get better without antibiotic medicine. You may be given antibiotics if you have: A severe infection. A weak immune system. If caused by narrow nasal passages or nasal polyps, surgery may be needed. Follow these instructions at home: Medicines Take, use, or apply over-the-counter and prescription medicines only as told by  your health care provider. These may include nasal sprays. If you were prescribed an antibiotic medicine, take it as told by your health care provider. Do not stop taking the antibiotic even if you start to feel better. Hydrate and humidify  Drink enough fluid to keep your urine pale yellow. Staying hydrated will help to thin your mucus. Use a cool mist humidifier to keep the humidity level in your home above 50%. Inhale steam for 10-15 minutes, 3-4 times a day, or as told by your health care provider. You can do this in the bathroom while a hot shower is running. Limit your exposure to cool or dry air. Rest Rest as much as possible. Sleep with your head raised (elevated). Make sure you get enough sleep each night. General instructions  Apply a warm, moist washcloth to your face 3-4 times a day or as told by your health care provider. This will help with discomfort. Use nasal saline washes as often as told by your health care provider. Wash your hands often with soap and water  to reduce your exposure to germs. If soap and water  are not available, use hand sanitizer. Do not smoke. Avoid being around people who are smoking (secondhand smoke). Keep all follow-up visits. This is important. Contact a  health care provider if: You have a fever. Your symptoms get worse. Your symptoms do not improve within 10 days. Get help right away if: You have a severe headache. You have persistent vomiting. You have severe pain or swelling around your face or eyes. You have vision problems. You develop confusion. Your neck is stiff. You have trouble breathing. These symptoms may be an emergency. Get help right away. Call 911. Do not wait to see if the symptoms will go away. Do not drive yourself to the hospital. Summary A sinus infection is soreness and inflammation of your sinuses. Sinuses are hollow spaces in the bones around your face. This condition is caused by nasal tissues that become inflamed  or swollen. The swelling traps or blocks the flow of mucus. This allows bacteria, viruses, and fungi to grow, which leads to infection. If you were prescribed an antibiotic medicine, take it as told by your health care provider. Do not stop taking the antibiotic even if you start to feel better. Keep all follow-up visits. This is important. This information is not intended to replace advice given to you by your health care provider. Make sure you discuss any questions you have with your health care provider. Document Revised: 08/27/2021 Document Reviewed: 08/27/2021 Elsevier Patient Education  2024 [employer]vier [employer]   If you have been instructed to have an in-person evaluation today at a local Urgent Care facility, please use the link below. It will take you to a list of all of our available [facility], including address, phone number and hours of operation. Please do not delay care.  [facility]  If you or a family member do not have a primary care provider, use the link below to schedule a visit and establish care. When you choose a [facility] primary care physician or advanced practice provider, you gain a long-term partner in health. Find a Primary Care Provider  Learn more about [facility]'s in-office and virtual care options:  - Get Care Now
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
